# Patient Record
Sex: Male | Born: 1980 | Race: White | Hispanic: No | Marital: Married | Smoking: Never smoker
Health system: Southern US, Community
[De-identification: ages and names within clinical notes are randomized; demographics above are authoritative.]

## PROBLEM LIST (undated history)

## (undated) DIAGNOSIS — K219 Gastro-esophageal reflux disease without esophagitis: Secondary | ICD-10-CM

## (undated) DIAGNOSIS — I10 Essential (primary) hypertension: Secondary | ICD-10-CM

## (undated) DIAGNOSIS — E785 Hyperlipidemia, unspecified: Secondary | ICD-10-CM

## (undated) HISTORY — DX: Gastro-esophageal reflux disease without esophagitis: K21.9

## (undated) HISTORY — DX: Hyperlipidemia, unspecified: E78.5

## (undated) HISTORY — PX: HAND SURGERY: SHX662

## (undated) HISTORY — DX: Essential (primary) hypertension: I10

---

## 2016-06-19 ENCOUNTER — Ambulatory Visit (HOSPITAL_COMMUNITY): Admission: EM | Admit: 2016-06-19 | Discharge: 2016-06-19 | Discharge status: Absent without leave | Payer: 59

## 2016-06-19 ENCOUNTER — Emergency Department
Admission: EM | Admit: 2016-06-19 | Discharge: 2016-06-19 | Disposition: A | Discharge status: Home / Self Care | Payer: 59 | Attending: Emergency Medicine | Admitting: Emergency Medicine

## 2016-06-19 DIAGNOSIS — Y998 Other external cause status: Secondary | ICD-10-CM | POA: Insufficient documentation

## 2016-06-19 DIAGNOSIS — Y93B3 Activity, free weights: Secondary | ICD-10-CM | POA: Diagnosis not present

## 2016-06-19 DIAGNOSIS — X500XXA Overexertion from strenuous movement or load, initial encounter: Secondary | ICD-10-CM | POA: Insufficient documentation

## 2016-06-19 DIAGNOSIS — M5432 Sciatica, left side: Secondary | ICD-10-CM | POA: Diagnosis not present

## 2016-06-19 DIAGNOSIS — M5442 Lumbago with sciatica, left side: Secondary | ICD-10-CM | POA: Diagnosis not present

## 2016-06-19 DIAGNOSIS — M545 Low back pain: Secondary | ICD-10-CM | POA: Diagnosis not present

## 2016-06-19 DIAGNOSIS — S39012A Strain of muscle, fascia and tendon of lower back, initial encounter: Secondary | ICD-10-CM | POA: Diagnosis not present

## 2016-06-19 DIAGNOSIS — Y929 Unspecified place or not applicable: Secondary | ICD-10-CM | POA: Diagnosis not present

## 2016-06-19 MED ORDER — PREDNISONE 10 MG PO TABS: 10.0000 mg | ORAL_TABLET | Freq: Every day | ORAL | 0 refills | Status: DC

## 2016-06-19 MED ORDER — KETOROLAC TROMETHAMINE 30 MG/ML IJ SOLN
30.0000 mg | Freq: Once | INTRAMUSCULAR | Status: AC
  Administered 2016-06-19: 30 mg via INTRAMUSCULAR
  Filled 2016-06-19: qty 1

## 2016-06-19 MED ORDER — DEXAMETHASONE SODIUM PHOSPHATE 10 MG/ML IJ SOLN
10.0000 mg | Freq: Once | INTRAMUSCULAR | Status: AC
  Administered 2016-06-19: 10 mg via INTRAMUSCULAR
  Filled 2016-06-19: qty 1

## 2016-06-19 MED ORDER — HYDROCODONE-ACETAMINOPHEN 5-325 MG PO TABS: 1.0000 | ORAL_TABLET | ORAL | 0 refills | Status: DC | PRN

## 2016-06-19 MED ORDER — DIAZEPAM 5 MG PO TABS: 5.0000 mg | ORAL_TABLET | Freq: Three times a day (TID) | ORAL | 0 refills | Status: AC | PRN

## 2016-06-19 NOTE — Discharge Instructions (Signed)
Please take medications as prescribed and follow-up with orthopedics office. Return to the ER for any loss of bowel or bladder function, weakness, inability to ambulate, any worsening symptoms urgent changes in health.

## 2016-06-19 NOTE — ED Notes (Signed)
AAOx3.  Skin warm and dry.  NAD 

## 2016-06-19 NOTE — ED Notes (Signed)
AAOx3.  Skin warm and dry.  NAD.  Posture upright and relaxed.  MAE equally and strong.

## 2016-06-19 NOTE — ED Triage Notes (Signed)
Pt presents to ED c/o back pain post heavy lifting at the gym. Pt went to urgent care and had xrays done. Because he has tingling in his legs he was asked to come here for further imaging

## 2016-06-19 NOTE — ED Notes (Signed)
Back pain, was told at urgent care he has a slipped disc, had XR and was told to come to for MRI. Pt has copy of CD with him.

## 2016-06-19 NOTE — ED Provider Notes (Signed)
[facility] Provider Note   CSN: [id number] Arrival date & time: 06/19/16  1511     History   Chief Complaint Chief Complaint  Patient presents with  . Back Pain    HPI Jacob Miranda is a 36 y.o. male presents to the emergency department for evaluation of acute lower back pain with radicular symptoms in the left leg. Patient states at 10 AM this morning he was dead lifting, approximately 150 pounds when he felt a sharp pain and pop in his lower back with severe tightness along the left and right paravertebral muscles of the lower lumbar spine. He developed instant burning numbness and tingling down the left leg, lateral thigh posterior lateral calf into the left great toe. Patient had a hard time ambulating, was able to take a shower and go to the urgent care facility where x-rays were taken and show no evidence of acute bony abnormality of the lumbar spine. Patient states he was sent to the emergency department for MRI of his lumbar spine. Patient has taken ibuprofen which is given a mild improvement. His pain is currently 7 out of 10. Earlier his pain was 10 out of 10. He is now ambulatory with mild tingling in the left leg, L5 distribution. Left leg numbness is increased with standing and hip flexion. He has had full control of his bowel and bladder, denies any loss of function. He denies any saddle anesthesia or numbness throughout the lower extremities. He denies any weakness of the lower extremities. No abdominal pain.  HPI  No past medical history on file.  There are no active problems to display for this patient.   No past surgical history on file.     Home Medications    Prior to Admission medications   Medication Sig Start Date End Date Taking? Authorizing Provider  diazepam (VALIUM) 5 MG tablet Take 1 tablet (5 mg total) by mouth every 8 (eight) hours as needed for anxiety. 06/19/16 06/19/17  Duanne Guess, PA-C  HYDROcodone-acetaminophen (NORCO)  5-325 MG tablet Take 1 tablet by mouth every 4 (four) hours as needed for moderate pain. 06/19/16   Duanne Guess, PA-C  predniSONE (DELTASONE) 10 MG tablet Take 1 tablet (10 mg total) by mouth daily. 6,5,4,3,2,1 six day taper 06/19/16   Duanne Guess, PA-C    Family History No family history on file.  Social History Social History  Substance Use Topics  . Smoking status: Not on file  . Smokeless tobacco: Not on file  . Alcohol use Not on file     Allergies   Patient has no known allergies.   Review of Systems Review of Systems  Constitutional: Negative.  Negative for activity change, appetite change, chills and fever.  HENT: Negative for congestion, ear pain, mouth sores, rhinorrhea, sinus pressure, sore throat and trouble swallowing.   Eyes: Negative for photophobia, pain and discharge.  Respiratory: Negative for cough, chest tightness and shortness of breath.   Cardiovascular: Negative for chest pain and leg swelling.  Gastrointestinal: Negative for abdominal distention, abdominal pain, diarrhea, nausea and vomiting.  Genitourinary: Negative for difficulty urinating and dysuria.  Musculoskeletal: Positive for back pain, gait problem and myalgias. Negative for arthralgias.  Skin: Negative for color change and rash.  Neurological: Positive for numbness. Negative for dizziness, weakness and headaches.  Hematological: Negative for adenopathy.  Psychiatric/Behavioral: Negative for agitation and behavioral problems.     Physical Exam Updated Vital Signs BP (!) 130/95 (BP Location: Left Arm)  Pulse 88   Temp 98.2 F (36.8 C) (Oral)   Resp 18   Ht 5\' 7"  (1.702 m)   Wt 86.2 kg (190 lb)   SpO2 99%   BMI 29.76 kg/m   Physical Exam  Constitutional: He appears well-developed and well-nourished. No distress.  Patient ambulatory with no assisted devices. Minimal pain with ambulation.  HENT:  Head: Normocephalic and atraumatic.  Eyes: Conjunctivae are normal.  Neck:  Normal range of motion. Neck supple.  Cardiovascular: Normal rate and regular rhythm.   Pulmonary/Chest: Effort normal and breath sounds normal. No respiratory distress.  Abdominal: Soft. He exhibits no distension. There is no tenderness.  Musculoskeletal:  Lumbar Spine: Examination of the lumbar spine reveals no bony abnormality, no edema, and no ecchymosis.  There is no step off.  The patient has full range of motion of the lumbar spine with flexion and extension.  The patient has normal lateral bend and rotation.  The patient has pain with lumbar flexion and extension.   The patient is mildly tender along the paravertebral muscles of the lumbar spine. No spinous process tenderness.  The patient is non tender along the iliac crest.  The patient is non tender in the sciatic notch.  The patient is non tender along the Sacroiliac joint.  There is no Coccyx joint tenderness.    Bilateral Lower Extremities: Examination of the lower extremities reveals no bony abnormality, no edema, and no ecchymosis.  The patient has full active and passive range of motion of the hips, knees, and ankles.  There is no discomfort with range of motion exercises.  The patient is non tender along the greater trochanter region.  The patient has a negative Bevelyn Buckles' test bilaterally.  There is normal skin warmth.  There is normal capillary refill bilaterally.    Neurologic: The patient has a negative straight leg raise and negative contralateral straight leg raise.  The patient has normal muscle strength testing for the quadriceps, calves, ankle dorsiflexion, ankle plantarflexion, and extensor hallicus longus.  The patient has sensation that is intact to light touch. No sensation discrepancy, no saddle anesthesia. The deep tendon reflexes are normal along the patella and Achilles bilaterally with no clonus noted.   Neurological: He is alert.  Skin: Skin is warm and dry.  Psychiatric: He has a normal mood and affect.  Nursing  note and vitals reviewed.    ED Treatments / Results  Labs (all labs ordered are listed, but only abnormal results are displayed) Labs Reviewed - No data to display  EKG  EKG Interpretation None       Radiology No results found.  Procedures Procedures (including critical care time) Rectal exam is normal, no external hemorrhoids, normal sphincter tone  Post void residual value 40  Medications Ordered in ED Medications  dexamethasone (DECADRON) injection 10 mg (10 mg Intramuscular Given 06/19/16 1638)  ketorolac (TORADOL) 30 MG/ML injection 30 mg (30 mg Intramuscular Given 06/19/16 1636)     Initial Impression / Assessment and Plan / ED Course  I have reviewed the triage vital signs and the nursing notes.  Pertinent labs & imaging results that were available during my care of the patient were reviewed by me and considered in my medical decision making (see chart for details).   36 year old male with acute lower back pain and left L5 radiculopathy. He has no weakness or neurological deficit. No saddle anesthesia. He is given 10 mg of dexamethasone IM, 30 mg of Toradol IM, symptoms significantly improved.  Sphincter tone on rectal exam as well as post for residual value within normal limits. Patient is treated with 6 day steroid taper, given Valium to help with lower lumbar muscular tightness. He will follow-up with orthopedics first of next week. He is educated on signs and symptoms to return to the ED for.  Final Clinical Impressions(s) / ED Diagnoses   Final diagnoses:  Sciatica of left side  Strain of lumbar region, initial encounter    New Prescriptions Discharge Medication List as of 06/19/2016  5:42 PM    START taking these medications   Details  diazepam (VALIUM) 5 MG tablet Take 1 tablet (5 mg total) by mouth every 8 (eight) hours as needed for anxiety., Starting Sun 06/19/2016, Until Mon 06/19/2017, Print    HYDROcodone-acetaminophen (NORCO) 5-325 MG tablet Take 1  tablet by mouth every 4 (four) hours as needed for moderate pain., Starting Sun 06/19/2016, Print    predniSONE (DELTASONE) 10 MG tablet Take 1 tablet (10 mg total) by mouth daily. 6,5,4,3,2,1 six day taper, Starting Sun 06/19/2016, Print         Duanne Guess, PA-C 06/19/16 1859    Nance Pear, MD 06/26/16 [phone]

## 2016-08-29 ENCOUNTER — Other Ambulatory Visit: Payer: Self-pay | Admitting: Internal Medicine

## 2016-08-29 DIAGNOSIS — E785 Hyperlipidemia, unspecified: Secondary | ICD-10-CM | POA: Insufficient documentation

## 2016-08-29 DIAGNOSIS — I10 Essential (primary) hypertension: Secondary | ICD-10-CM | POA: Diagnosis not present

## 2016-08-29 DIAGNOSIS — Z0000 Encounter for general adult medical examination without abnormal findings: Secondary | ICD-10-CM | POA: Diagnosis not present

## 2016-08-29 DIAGNOSIS — F419 Anxiety disorder, unspecified: Secondary | ICD-10-CM | POA: Insufficient documentation

## 2016-08-29 DIAGNOSIS — M5431 Sciatica, right side: Secondary | ICD-10-CM

## 2016-08-29 DIAGNOSIS — K219 Gastro-esophageal reflux disease without esophagitis: Secondary | ICD-10-CM | POA: Insufficient documentation

## 2016-08-31 DIAGNOSIS — E782 Mixed hyperlipidemia: Secondary | ICD-10-CM | POA: Diagnosis not present

## 2016-08-31 DIAGNOSIS — I10 Essential (primary) hypertension: Secondary | ICD-10-CM | POA: Diagnosis not present

## 2016-08-31 DIAGNOSIS — Z125 Encounter for screening for malignant neoplasm of prostate: Secondary | ICD-10-CM | POA: Diagnosis not present

## 2016-08-31 DIAGNOSIS — Z79899 Other long term (current) drug therapy: Secondary | ICD-10-CM | POA: Diagnosis not present

## 2016-09-05 ENCOUNTER — Ambulatory Visit: Payer: 59

## 2016-09-19 DIAGNOSIS — M9902 Segmental and somatic dysfunction of thoracic region: Secondary | ICD-10-CM | POA: Diagnosis not present

## 2016-09-19 DIAGNOSIS — R51 Headache: Secondary | ICD-10-CM | POA: Diagnosis not present

## 2016-09-19 DIAGNOSIS — M9901 Segmental and somatic dysfunction of cervical region: Secondary | ICD-10-CM | POA: Diagnosis not present

## 2016-09-22 DIAGNOSIS — M9902 Segmental and somatic dysfunction of thoracic region: Secondary | ICD-10-CM | POA: Diagnosis not present

## 2016-09-22 DIAGNOSIS — M9901 Segmental and somatic dysfunction of cervical region: Secondary | ICD-10-CM | POA: Diagnosis not present

## 2016-09-22 DIAGNOSIS — R51 Headache: Secondary | ICD-10-CM | POA: Diagnosis not present

## 2016-09-23 DIAGNOSIS — T63441A Toxic effect of venom of bees, accidental (unintentional), initial encounter: Secondary | ICD-10-CM | POA: Diagnosis not present

## 2016-09-23 DIAGNOSIS — Z23 Encounter for immunization: Secondary | ICD-10-CM | POA: Diagnosis not present

## 2016-09-23 DIAGNOSIS — L03114 Cellulitis of left upper limb: Secondary | ICD-10-CM | POA: Diagnosis not present

## 2016-09-27 DIAGNOSIS — R51 Headache: Secondary | ICD-10-CM | POA: Diagnosis not present

## 2016-09-27 DIAGNOSIS — M9901 Segmental and somatic dysfunction of cervical region: Secondary | ICD-10-CM | POA: Diagnosis not present

## 2016-09-27 DIAGNOSIS — M9902 Segmental and somatic dysfunction of thoracic region: Secondary | ICD-10-CM | POA: Diagnosis not present

## 2016-11-17 DIAGNOSIS — J40 Bronchitis, not specified as acute or chronic: Secondary | ICD-10-CM | POA: Diagnosis not present

## 2017-01-04 DIAGNOSIS — E782 Mixed hyperlipidemia: Secondary | ICD-10-CM | POA: Diagnosis not present

## 2017-01-04 DIAGNOSIS — I10 Essential (primary) hypertension: Secondary | ICD-10-CM | POA: Diagnosis not present

## 2017-04-03 DIAGNOSIS — M9902 Segmental and somatic dysfunction of thoracic region: Secondary | ICD-10-CM | POA: Diagnosis not present

## 2017-04-03 DIAGNOSIS — M9901 Segmental and somatic dysfunction of cervical region: Secondary | ICD-10-CM | POA: Diagnosis not present

## 2017-04-03 DIAGNOSIS — R51 Headache: Secondary | ICD-10-CM | POA: Diagnosis not present

## 2017-06-13 DIAGNOSIS — E782 Mixed hyperlipidemia: Secondary | ICD-10-CM | POA: Diagnosis not present

## 2017-06-13 DIAGNOSIS — Z79899 Other long term (current) drug therapy: Secondary | ICD-10-CM | POA: Diagnosis not present

## 2017-06-20 DIAGNOSIS — I10 Essential (primary) hypertension: Secondary | ICD-10-CM | POA: Diagnosis not present

## 2017-06-20 DIAGNOSIS — E782 Mixed hyperlipidemia: Secondary | ICD-10-CM | POA: Diagnosis not present

## 2017-07-03 DIAGNOSIS — R109 Unspecified abdominal pain: Secondary | ICD-10-CM | POA: Diagnosis not present

## 2017-07-11 DIAGNOSIS — R3129 Other microscopic hematuria: Secondary | ICD-10-CM | POA: Diagnosis not present

## 2017-07-12 ENCOUNTER — Other Ambulatory Visit: Payer: Self-pay | Admitting: Internal Medicine

## 2017-07-12 DIAGNOSIS — R3129 Other microscopic hematuria: Secondary | ICD-10-CM

## 2017-07-14 ENCOUNTER — Ambulatory Visit
Admission: RE | Admit: 2017-07-14 | Discharge: 2017-07-14 | Disposition: A | Discharge status: Home / Self Care | Payer: 59 | Source: Ambulatory Visit | Attending: Internal Medicine | Admitting: Internal Medicine

## 2017-07-14 DIAGNOSIS — R3129 Other microscopic hematuria: Secondary | ICD-10-CM | POA: Insufficient documentation

## 2017-07-21 DIAGNOSIS — R319 Hematuria, unspecified: Secondary | ICD-10-CM | POA: Diagnosis not present

## 2017-07-27 ENCOUNTER — Emergency Department: Payer: 59

## 2017-07-27 ENCOUNTER — Other Ambulatory Visit: Payer: Self-pay

## 2017-07-27 ENCOUNTER — Emergency Department
Admission: EM | Admit: 2017-07-27 | Discharge: 2017-07-27 | Disposition: A | Discharge status: Home / Self Care | Payer: 59 | Attending: Emergency Medicine | Admitting: Emergency Medicine

## 2017-07-27 ENCOUNTER — Encounter: Payer: Self-pay | Admitting: Emergency Medicine

## 2017-07-27 DIAGNOSIS — I10 Essential (primary) hypertension: Secondary | ICD-10-CM | POA: Insufficient documentation

## 2017-07-27 DIAGNOSIS — R1031 Right lower quadrant pain: Secondary | ICD-10-CM | POA: Diagnosis not present

## 2017-07-27 DIAGNOSIS — R109 Unspecified abdominal pain: Secondary | ICD-10-CM

## 2017-07-27 DIAGNOSIS — R110 Nausea: Secondary | ICD-10-CM | POA: Diagnosis not present

## 2017-07-27 DIAGNOSIS — R1011 Right upper quadrant pain: Secondary | ICD-10-CM | POA: Insufficient documentation

## 2017-07-27 LAB — CBC WITH DIFFERENTIAL/PLATELET
BASOS PCT: 1 %
Basophils Absolute: 0.1 10*3/uL (ref 0–0.1)
Eosinophils Absolute: 0.2 10*3/uL (ref 0–0.7)
Eosinophils Relative: 3 %
HEMATOCRIT: 47.6 % (ref 40.0–52.0)
Hemoglobin: 16.8 g/dL (ref 13.0–18.0)
LYMPHS ABS: 2.7 10*3/uL (ref 1.0–3.6)
Lymphocytes Relative: 32 %
MCH: 30.9 pg (ref 26.0–34.0)
MCHC: 35.4 g/dL (ref 32.0–36.0)
MCV: 87.3 fL (ref 80.0–100.0)
MONOS PCT: 10 %
Monocytes Absolute: 0.8 10*3/uL (ref 0.2–1.0)
NEUTROS ABS: 4.7 10*3/uL (ref 1.4–6.5)
NEUTROS PCT: 54 %
Platelets: 253 10*3/uL (ref 150–440)
RBC: 5.45 MIL/uL (ref 4.40–5.90)
RDW: 12.2 % (ref 11.5–14.5)
WBC: 8.6 10*3/uL (ref 3.8–10.6)

## 2017-07-27 LAB — COMPREHENSIVE METABOLIC PANEL
ALT: 28 U/L (ref 0–44)
ANION GAP: 8 (ref 5–15)
AST: 26 U/L (ref 15–41)
Albumin: 5 g/dL (ref 3.5–5.0)
Alkaline Phosphatase: 49 U/L (ref 38–126)
BUN: 20 mg/dL (ref 6–20)
CHLORIDE: 102 mmol/L (ref 98–111)
CO2: 28 mmol/L (ref 22–32)
Calcium: 9.7 mg/dL (ref 8.9–10.3)
Creatinine, Ser: 0.92 mg/dL (ref 0.61–1.24)
Glucose, Bld: 102 mg/dL — ABNORMAL HIGH (ref 70–99)
POTASSIUM: 4 mmol/L (ref 3.5–5.1)
SODIUM: 138 mmol/L (ref 135–145)
Total Bilirubin: 1.1 mg/dL (ref 0.3–1.2)
Total Protein: 8.8 g/dL — ABNORMAL HIGH (ref 6.5–8.1)

## 2017-07-27 LAB — URINALYSIS, COMPLETE (UACMP) WITH MICROSCOPIC
BACTERIA UA: NONE SEEN
BILIRUBIN URINE: NEGATIVE
GLUCOSE, UA: NEGATIVE mg/dL
KETONES UR: NEGATIVE mg/dL
Leukocytes, UA: NEGATIVE
NITRITE: NEGATIVE
PROTEIN: NEGATIVE mg/dL
Specific Gravity, Urine: 1.021 (ref 1.005–1.030)
pH: 5 (ref 5.0–8.0)

## 2017-07-27 NOTE — ED Notes (Signed)
Pt alert and oriented X4, active, cooperative, pt in NAD. RR even and unlabored, color WNL.  Pt informed to return if any life threatening symptoms occur.  Discharge and followup instructions reviewed.  

## 2017-07-27 NOTE — ED Triage Notes (Signed)
Right side abd pain for couple weeks.  Had ultrasound  and it is not stones.  Says pain worse now.

## 2017-07-27 NOTE — ED Notes (Signed)
ED Provider at bedside. 

## 2017-07-27 NOTE — Discharge Instructions (Addendum)

## 2017-07-27 NOTE — ED Provider Notes (Signed)
[facility] Emergency Department Provider Note  ____________________________________________  Time seen: Approximately 9:02 AM  I have reviewed the triage vital signs and the nursing notes.   HISTORY  Chief Complaint Abdominal Pain   HPI Jacob Miranda is a 37 y.o. male with a history of hypertension, hyperlipidemia and GERD who presents for evaluation of right flank pain.  Patient reports intermittent right-sided flank pain for several weeks.  He reports the pain as a dull ache pain has been intermittent, moderate in intensity, located in the right flank and right sided abdomen.  He has had some nausea but no vomiting, no fever or chills.  Pain is not worse after eating.  Pain is nonpleuritic in nature.  He saw his primary care doctor twice for this problem and both times was noted to have microscopic hematuria on urinalysis.  He underwent a renal ultrasound which was unremarkable.  Patient reports that the pain started again today.  He denies dysuria or hematuria, fever or chills.  He does report history of constipation and diarrhea for the last several months.  No prior history of IBD, IBS, celiac.  No unintentional weight loss or night sweats.  No personal family history of blood clots, no recent travel immobilization, no leg pain or swelling, no hemoptysis, no exogenous hormones, no history of cancer.  PMH HTN HLD GERD  Past Surgical History:  Procedure Laterality Date  . HAND SURGERY Right     Prior to Admission medications   Medication Sig Start Date End Date Taking? Authorizing Provider  HYDROcodone-acetaminophen (NORCO) 5-325 MG tablet Take 1 tablet by mouth every 4 (four) hours as needed for moderate pain. 06/19/16   Duanne Guess, PA-C  predniSONE (DELTASONE) 10 MG tablet Take 1 tablet (10 mg total) by mouth daily. 6,5,4,3,2,1 six day taper 06/19/16   Duanne Guess, PA-C    Allergies Penicillin g  No family history on file.  Social  History Social History   Tobacco Use  . Smoking status: Never Smoker  . Smokeless tobacco: Never Used  Substance Use Topics  . Alcohol use: Yes  . Drug use: Not on file    Review of Systems  Constitutional: Negative for fever. Eyes: Negative for visual changes. ENT: Negative for sore throat. Neck: No neck pain  Cardiovascular: Negative for chest pain. Respiratory: Negative for shortness of breath. Gastrointestinal: + R sided abdominal pain. No vomiting or diarrhea. Genitourinary: Negative for dysuria. + R flank pain Musculoskeletal: Negative for back pain. Skin: Negative for rash. Neurological: Negative for headaches, weakness or numbness. Psych: No SI or HI  ____________________________________________   PHYSICAL EXAM:  VITAL SIGNS: ED Triage Vitals  Enc Vitals Group     BP 07/27/17 0830 123/88     Pulse Rate 07/27/17 0830 76     Resp 07/27/17 0830 14     Temp 07/27/17 0830 97.7 F (36.5 C)     Temp Source 07/27/17 0830 Oral     SpO2 07/27/17 0830 99 %     Weight 07/27/17 0826 195 lb (88.5 kg)     Height 07/27/17 0826 5\' 7"  (1.702 m)     Head Circumference --      Peak Flow --      Pain Score 07/27/17 0826 7     Pain Loc --      Pain Edu? --      Excl. in [place]? --     Constitutional: Alert and oriented. Well appearing and in no apparent  distress. HEENT:      Head: Normocephalic and atraumatic.         Eyes: Conjunctivae are normal. Sclera is non-icteric.       Mouth/Throat: Mucous membranes are moist.       Neck: Supple with no signs of meningismus. Cardiovascular: Regular rate and rhythm. No murmurs, gallops, or rubs. 2+ symmetrical distal pulses are present in all extremities. No JVD. Respiratory: Normal respiratory effort. Lungs are clear to auscultation bilaterally. No wheezes, crackles, or rhonchi.  Gastrointestinal: Soft, mild tenderness to palpation over the RUQ, and non distended with positive bowel sounds. No rebound or guarding. Genitourinary: No  CVA tenderness. Musculoskeletal: Nontender with normal range of motion in all extremities. No edema, cyanosis, or erythema of extremities. Neurologic: Normal speech and language. Face is symmetric. Moving all extremities. No gross focal neurologic deficits are appreciated. Skin: Skin is warm, dry and intact. No rash noted. Psychiatric: Mood and affect are normal. Speech and behavior are normal.  ____________________________________________   LABS (all labs ordered are listed, but only abnormal results are displayed)  Labs Reviewed  URINALYSIS, COMPLETE (UACMP) WITH MICROSCOPIC - Abnormal; Notable for the following components:      Result Value   Color, Urine YELLOW (*)    APPearance CLEAR (*)    Hgb urine dipstick SMALL (*)    All other components within normal limits  COMPREHENSIVE METABOLIC PANEL - Abnormal; Notable for the following components:   Glucose, Bld 102 (*)    Total Protein 8.8 (*)    All other components within normal limits  CBC WITH DIFFERENTIAL/PLATELET   ____________________________________________  EKG  none  ____________________________________________  RADIOLOGY  I have personally reviewed the images performed during this visit and I agree with the Radiologist's read.   Interpretation by Radiologist:  Ct Renal Stone Study  Result Date: 07/27/2017 CLINICAL DATA:  Right-sided abdominal pain for several weeks EXAM: CT ABDOMEN AND PELVIS WITHOUT CONTRAST TECHNIQUE: Multidetector CT imaging of the abdomen and pelvis was performed following the standard protocol without IV contrast. COMPARISON:  Renal ultrasound from 07/14/2017 FINDINGS: Lower chest: No acute abnormality. Hepatobiliary: No focal liver abnormality is seen. No gallstones, gallbladder wall thickening, or biliary dilatation. Pancreas: Unremarkable. No pancreatic ductal dilatation or surrounding inflammatory changes. Spleen: Normal in size without focal abnormality. Adrenals/Urinary Tract: Adrenal  glands are unremarkable. Kidneys are normal, without renal calculi, focal lesion, or hydronephrosis. Bladder is decompressed. Stomach/Bowel: Stomach is within normal limits. Appendix appears normal. No evidence of bowel wall thickening, distention, or inflammatory changes. Vascular/Lymphatic: No significant vascular findings are present. No enlarged abdominal or pelvic lymph nodes. Reproductive: Prostate is unremarkable. Other: No abdominal wall hernia or abnormality. No abdominopelvic ascites. Musculoskeletal: No acute or significant osseous findings. IMPRESSION: No acute abnormality noted. Electronically Signed   By: Inez Catalina M.D.   On: 07/27/2017 09:03   US Abdomen Limited Ruq  Result Date: 07/27/2017 CLINICAL DATA:  Right upper quadrant pain EXAM: ULTRASOUND ABDOMEN LIMITED RIGHT UPPER QUADRANT COMPARISON:  CT earlier today FINDINGS: Gallbladder: No gallstones or wall thickening visualized. No sonographic Murphy sign noted by sonographer. Common bile duct: Diameter: Normal caliber, 3 mm Liver: No focal lesion identified. Within normal limits in parenchymal echogenicity. Portal vein is patent on color Doppler imaging with normal direction of blood flow towards the liver. IMPRESSION: Negative right upper quadrant ultrasound. Electronically Signed   By: Rolm Baptise M.D.   On: 07/27/2017 09:54     ____________________________________________   PROCEDURES  Procedure(s) performed: None Procedures  Critical Care performed:  None ____________________________________________   INITIAL IMPRESSION / ASSESSMENT AND PLAN / ED COURSE   37 y.o. male with a history of hypertension, hyperlipidemia and GERD who presents for evaluation of right flank pain/ Right sided abdominal pain intermittent for several weeks associated with microscopic hematuria.  Patient is well-appearing, no distress, has normal vital signs, abdomen is slightly tender on the right upper quadrant with no rebound or guarding, no Murphy  sign, no right lower quadrant tenderness, no flank tenderness.  Urinalysis showing small amount of hemoglobin but no RBCs.  Labs including CBC and CMP are pending.  CT renal showing no evidence of kidney stone, renal cyst, appendicitis, pancreatitis, small bowel obstruction.  Possibly gallbladder disease although no stones are seen on CT scan. PERC negative    _________________________ 10:29 AM on 07/27/2017 -----------------------------------------  CT renal with no acute findings, right upper quadrant ultrasound negative for gallbladder etiology.  Patient remains well-appearing.  Will refer to GI for further evaluation.  Discussed return precautions for new or worsening pain, fever, chest pain, shortness of breath.   As part of my medical decision making, I reviewed the following data within the [facility] notes reviewed and incorporated, Labs reviewed , Old chart reviewed, Radiograph reviewed , Discussed with admitting physician , Notes from prior ED visits and [place] Controlled Substance Database    Pertinent labs & imaging results that were available during my care of the patient were reviewed by me and considered in my medical decision making (see chart for details).    ____________________________________________   FINAL CLINICAL IMPRESSION(S) / ED DIAGNOSES  Final diagnoses:  RUQ abdominal pain  Flank pain      NEW MEDICATIONS STARTED DURING THIS VISIT:  ED Discharge Orders    None       Note:  This document was prepared using Dragon voice recognition software and may include unintentional dictation errors.    Alfred Levins, [place], MD 07/27/17 1030

## 2017-08-24 DIAGNOSIS — R109 Unspecified abdominal pain: Secondary | ICD-10-CM | POA: Diagnosis not present

## 2017-08-24 DIAGNOSIS — R319 Hematuria, unspecified: Secondary | ICD-10-CM | POA: Diagnosis not present

## 2017-08-25 DIAGNOSIS — R1031 Right lower quadrant pain: Secondary | ICD-10-CM | POA: Diagnosis not present

## 2017-08-25 DIAGNOSIS — R3129 Other microscopic hematuria: Secondary | ICD-10-CM | POA: Diagnosis not present

## 2017-08-25 DIAGNOSIS — R300 Dysuria: Secondary | ICD-10-CM | POA: Diagnosis not present

## 2017-09-04 ENCOUNTER — Encounter

## 2017-09-04 ENCOUNTER — Ambulatory Visit: Payer: 59 | Admitting: Gastroenterology

## 2017-09-04 ENCOUNTER — Encounter: Payer: Self-pay | Admitting: Gastroenterology

## 2017-09-04 VITALS — BP 121/84 | HR 61 | Ht 67.0 in | Wt 198.2 lb

## 2017-09-04 DIAGNOSIS — Z8619 Personal history of other infectious and parasitic diseases: Secondary | ICD-10-CM

## 2017-09-04 DIAGNOSIS — R1030 Lower abdominal pain, unspecified: Secondary | ICD-10-CM | POA: Diagnosis not present

## 2017-09-04 NOTE — Progress Notes (Signed)
Jacob Miranda [address]  [place]  [place], [place] [address]  Main: [phone]  Fax: [phone]   Gastroenterology Consultation  Referring Provider:     Idelle Crouch, MD Primary Care Physician:  Idelle Crouch, MD Primary Gastroenterologist:  Dr. Vonda Miranda Reason for Consultation:     Abdominal pain        HPI:    Chief Complaint  Patient presents with  . New Patient (Initial Visit)    ED F/U right flank pain and nausea    Jacob Miranda is a 37 y.o. y/o male referred for consultation & management  by Dr. Idelle Crouch, MD.  Patient visited the ER in July 2019 due to right flank pain and abdominal pain.  Symptoms started 6 weeks ago.  Right flank pain occurs more than abdominal pain and they occur at different timings.  Right flank pain is dull, burning, nonradiating, not associated with any nausea or vomiting, 5/10, comes on with certain movements such as rotating to that side, and relieves with other movements.  Abdominal pain occurs unrelated to the right flank pain, is bilateral lower quadrant to periumbilical area, sharp, occurs more when he is laying down, 8/10, nonradiating, not associate with any nausea or vomiting.  No dysphagia or weight loss.  No blood in stool.  Reports 1-2 formed bowel movements a day without straining.  Reports history of GERD and used to be on daily ranitidine for a year, about 4 to 5 years ago.  Also states he was treated for H. pylori around that time.  Stop taking the ranitidine as he did not want to be on chronic medications.  Now reports once a day bad taste in mouth or belching.  No prior EGDs or colonoscopies.  Eats a high protein diet.  Takes Metamucil as needed when he has not had enough fiber as he takes his protein to fiber ratio, is higher in protein.  No family history of colon cancer  Past medical history: GERD, history of H. pylori  Past Surgical History:  Procedure Laterality Date  . HAND  SURGERY Right     Prior to Admission medications   Medication Sig Start Date End Date Taking? Authorizing Provider  ALPRAZolam Duanne Moron) 0.5 MG tablet  09/01/17   [provider]  bisoprolol-hydrochlorothiazide (ZIAC) 5-6.25 MG tablet Take 1 tablet by mouth daily. 08/27/17   [provider]  levofloxacin (LEVAQUIN) 250 MG tablet Take 250 mg by mouth daily. 08/25/17   [provider]    Family history: No family history of colon cancer, or GI cancers, or any other cancers in the immediate family.  Brother has Crohn's disease or ulcerative colitis, patient does not know which one  Social History   Tobacco Use  . Smoking status: Never Smoker  . Smokeless tobacco: Never Used  Substance Use Topics  . Alcohol use: Yes  . Drug use: Not on file    Allergies as of 09/04/2017 - Review Complete 09/04/2017  Allergen Reaction Noted  . Penicillin g Other (See Comments) 08/29/2016    Review of Systems:    All systems reviewed and negative except where noted in HPI.   Physical Exam:  BP 121/84   Pulse 61   Ht 5\' 7"  (1.702 m)   Wt 198 lb 3.2 oz (89.9 kg)   BMI 31.04 kg/m  No LMP for male patient. Psych:  Alert and cooperative. Normal mood and affect. General:   Alert,  Well-developed, well-nourished,  pleasant and cooperative in NAD Head:  Normocephalic and atraumatic. Eyes:  Sclera clear, no icterus.   Conjunctiva pink. Ears:  Normal auditory acuity. Nose:  No deformity, discharge, or lesions. Mouth:  No deformity or lesions,oropharynx pink & moist. Neck:  Supple; no masses or thyromegaly. Abdomen:  Normal bowel sounds.  No bruits.  Soft, non-tender and non-distended without masses, hepatosplenomegaly or hernias noted.  No guarding or rebound tenderness.    Msk:  Symmetrical without gross deformities. Good, equal movement & strength bilaterally. Pulses:  Normal pulses noted. Extremities:  No clubbing or edema.  No cyanosis. Neurologic:  Alert and oriented x3;   grossly normal neurologically. Skin:  Intact without significant lesions or rashes. No jaundice. Lymph Nodes:  No significant cervical adenopathy. Psych:  Alert and cooperative. Normal mood and affect.   Labs: CBC    Component Value Date/Time   WBC 8.6 07/27/2017 0833   RBC 5.45 07/27/2017 0833   HGB 16.8 07/27/2017 0833   HCT 47.6 07/27/2017 0833   PLT 253 07/27/2017 0833   MCV 87.3 07/27/2017 0833   MCH 30.9 07/27/2017 0833   MCHC 35.4 07/27/2017 0833   RDW 12.2 07/27/2017 0833   LYMPHSABS 2.7 07/27/2017 0833   MONOABS 0.8 07/27/2017 0833   EOSABS 0.2 07/27/2017 0833   BASOSABS 0.1 07/27/2017 0833   CMP     Component Value Date/Time   NA 138 07/27/2017 0838   K 4.0 07/27/2017 0838   CL 102 07/27/2017 0838   CO2 28 07/27/2017 0838   GLUCOSE 102 (H) 07/27/2017 0838   BUN 20 07/27/2017 0838   CREATININE 0.92 07/27/2017 0838   CALCIUM 9.7 07/27/2017 0838   PROT 8.8 (H) 07/27/2017 0838   ALBUMIN 5.0 07/27/2017 0838   AST 26 07/27/2017 0838   ALT 28 07/27/2017 0838   ALKPHOS 49 07/27/2017 0838   BILITOT 1.1 07/27/2017 0838   GFRNONAA >60 07/27/2017 0838   GFRAA >60 07/27/2017 4431    Imaging Studies: No results found.  Assessment and Plan:   [name] is a 37 y.o. y/o male has been referred for abdominal pain, and right flank pain  His abdominal pain and right flank pain appear to be unrelated, and do not occur at the same time Right flank pain is likely musculoskeletal He has microscopic hematuria for 2 months now, with no other urinary symptoms, and his pain has not resolved despite treatment antibiotics by his primary care provider. I have asked him to follow-up with urology as scheduled this week for this  In regard to his abdominal pain, his CT was negative and very reassuring Lab work is reassuring as well Patient works out frequently, 4-5 times a week, and lifts heavy equipment Abdominal pain may be musculoskeletal as well No alarm symptoms  present  Given his history of H. pylori, and he states his abdominal pain feels like bloating at times, will check stool for H. pylori He does not eat enough fiber in diet is heavy on protein High-fiber diet MiraLAX or Metamucil daily with goal of 1-2 soft bowel movements daily.  If not at goal, patient instructed to increase dose to twice daily.  If loose stools with the medication, patient asked to decrease the medication to every other day, or half dose daily.  Patient verbalized understanding  After urology work-up, will follow-up in clinic to see if symptoms have improved  For his daily heartburn, we will not start any new medications at this time Instead will concentrate on lifestyle  modifications until urology work-up is complete and to see if above measures help Patient educated extensively on acid reflux lifestyle modification, including buying a bed wedge, not eating 3 hrs before bedtime, diet modifications, and handout given for the same.   If symptoms do not improve, can consider H2 RA therapy  No alarm symptoms present to indicate EGD or colonoscopy at this time  Dr Jacob Miranda

## 2017-09-04 NOTE — Patient Instructions (Signed)
F/U 3 months Bed wedge miralax daily  High-Fiber Diet Fiber, also called dietary fiber, is a type of carbohydrate found in fruits, vegetables, whole grains, and beans. A high-fiber diet can have many health benefits. Your health care provider may recommend a high-fiber diet to help:  Prevent constipation. Fiber can make your bowel movements more regular.  Lower your cholesterol.  Relieve hemorrhoids, uncomplicated diverticulosis, or irritable bowel syndrome.  Prevent overeating as part of a weight-loss plan.  Prevent heart disease, type 2 diabetes, and certain cancers.  What is my plan? The recommended daily intake of fiber includes:  38 grams for men under age 41.  101 grams for men over age 33.  5 grams for women under age 26.  76 grams for women over age 107.  You can get the recommended daily intake of dietary fiber by eating a variety of fruits, vegetables, grains, and beans. Your health care provider may also recommend a fiber supplement if it is not possible to get enough fiber through your diet. What do I need to know about a high-fiber diet?  Fiber supplements have not been widely studied for their effectiveness, so it is better to get fiber through food sources.  Always check the fiber content on thenutrition facts label of any prepackaged food. Look for foods that contain at least 5 grams of fiber per serving.  Ask your dietitian if you have questions about specific foods that are related to your condition, especially if those foods are not listed in the following section.  Increase your daily fiber consumption gradually. Increasing your intake of dietary fiber too quickly may cause bloating, cramping, or gas.  Drink plenty of water. Water helps you to digest fiber. What foods can I eat? Grains Whole-grain breads. Multigrain cereal. Oats and oatmeal. Brown rice. Barley. Bulgur wheat. [place]. Bran muffins. Popcorn. Rye wafer crackers. Vegetables Sweet potatoes.  Spinach. Kale. Artichokes. Cabbage. Broccoli. Green peas. Carrots. Squash. Fruits Berries. Pears. Apples. Oranges. Avocados. Prunes and raisins. Dried figs. Meats and Other Protein Sources Navy, kidney, pinto, and soy beans. Split peas. Lentils. Nuts and seeds. Dairy Fiber-fortified yogurt. Beverages Fiber-fortified soy milk. Fiber-fortified orange juice. Other Fiber bars. The items listed above may not be a complete list of recommended foods or beverages. Contact your dietitian for more options. What foods are not recommended? Grains White bread. Pasta made with refined flour. White rice. Vegetables Fried potatoes. Canned vegetables. Well-cooked vegetables. Fruits Fruit juice. Cooked, strained fruit. Meats and Other Protein Sources Fatty cuts of meat. Fried Sales executive or fried fish. Dairy Milk. Yogurt. Cream cheese. Sour cream. Beverages Soft drinks. Other Cakes and pastries. Butter and oils. The items listed above may not be a complete list of foods and beverages to avoid. Contact your dietitian for more information. What are some tips for including high-fiber foods in my diet?  Eat a wide variety of high-fiber foods.  Make sure that half of all grains consumed each day are whole grains.  Replace breads and cereals made from refined flour or white flour with whole-grain breads and cereals.  Replace white rice with brown rice, bulgur wheat, or millet.  Start the day with a breakfast that is high in fiber, such as a cereal that contains at least 5 grams of fiber per serving.  Use beans in place of meat in soups, salads, or pasta.  Eat high-fiber snacks, such as berries, raw vegetables, nuts, or popcorn. This information is not intended to replace advice given to you by your  health care provider. Make sure you discuss any questions you have with your health care provider. Document Released: 01/10/2005 Document Revised: 06/18/2015 Document Reviewed: 06/25/2013 Elsevier  Interactive Patient Education  [employer].

## 2017-09-05 ENCOUNTER — Other Ambulatory Visit
Admission: RE | Admit: 2017-09-05 | Discharge: 2017-09-05 | Disposition: A | Discharge status: Home / Self Care | Payer: 59 | Source: Ambulatory Visit | Attending: Gastroenterology | Admitting: Gastroenterology

## 2017-09-05 DIAGNOSIS — Z8619 Personal history of other infectious and parasitic diseases: Secondary | ICD-10-CM | POA: Insufficient documentation

## 2017-09-06 LAB — H. PYLORI ANTIGEN, STOOL: H. PYLORI STOOL AG, EIA: NEGATIVE

## 2017-09-08 ENCOUNTER — Ambulatory Visit: Payer: 59 | Admitting: Urology

## 2017-09-08 ENCOUNTER — Telehealth: Payer: Self-pay | Admitting: Gastroenterology

## 2017-09-08 ENCOUNTER — Encounter: Payer: Self-pay | Admitting: Urology

## 2017-09-08 VITALS — BP 136/85 | HR 67 | Ht 67.0 in | Wt 195.8 lb

## 2017-09-08 DIAGNOSIS — R319 Hematuria, unspecified: Secondary | ICD-10-CM | POA: Diagnosis not present

## 2017-09-08 DIAGNOSIS — R3121 Asymptomatic microscopic hematuria: Secondary | ICD-10-CM

## 2017-09-08 DIAGNOSIS — R102 Pelvic and perineal pain: Secondary | ICD-10-CM

## 2017-09-08 LAB — MICROSCOPIC EXAMINATION
BACTERIA UA: NONE SEEN
Epithelial Cells (non renal): NONE SEEN /hpf (ref 0–10)
WBC, UA: NONE SEEN /hpf (ref 0–5)

## 2017-09-08 LAB — URINALYSIS, COMPLETE
Bilirubin, UA: NEGATIVE
GLUCOSE, UA: NEGATIVE
Ketones, UA: NEGATIVE
LEUKOCYTES UA: NEGATIVE
Nitrite, UA: NEGATIVE
PH UA: 5 (ref 5.0–7.5)
PROTEIN UA: NEGATIVE
SPEC GRAV UA: 1.02 (ref 1.005–1.030)
Urobilinogen, Ur: 0.2 mg/dL (ref 0.2–1.0)

## 2017-09-08 NOTE — Patient Instructions (Signed)
Cystoscopy  Cystoscopy is a procedure that is used to help diagnose and sometimes treat conditions that affect that lower urinary tract. The lower urinary tract includes the bladder and the tube that drains urine from the bladder out of the body (urethra). Cystoscopy is performed with a thin, tube-shaped instrument with a light and camera at the end (cystoscope). The cystoscope may be hard (rigid) or flexible, depending on the goal of the procedure.The cystoscope is inserted through the urethra, into the bladder.  Cystoscopy may be recommended if you have:   Urinary tractinfections that keep coming back (recurring).   Blood in the urine (hematuria).   Loss of bladder control (urinary incontinence) or an overactive bladder.   Unusual cells found in a urine sample.   A blockage in the urethra.   Painful urination.   An abnormality in the bladder found during an intravenous pyelogram (IVP) or CT scan.    Cystoscopy may also be done to remove a sample of tissue to be examined under a microscope (biopsy).  Tell a health care provider about:   Any allergies you have.   All medicines you are taking, including vitamins, herbs, eye drops, creams, and over-the-counter medicines.   Any problems you or family members have had with anesthetic medicines.   Any blood disorders you have.   Any surgeries you have had.   Any medical conditions you have.   Whether you are pregnant or may be pregnant.  What are the risks?  Generally, this is a safe procedure. However, problems may occur, including:   Infection.   Bleeding.   Allergic reactions to medicines.   Damage to other structures or organs.    What happens before the procedure?   Ask your health care provider about:  ? Changing or stopping your regular medicines. This is especially important if you are taking diabetes medicines or blood thinners.  ? Taking medicines such as aspirin and ibuprofen. These medicines can thin your blood. Do not take these medicines  before your procedure if your health care provider instructs you not to.   Follow instructions from your health care provider about eating or drinking restrictions.   You may be given antibiotic medicine to help prevent infection.   You may have an exam or testing, such as X-rays of the bladder, urethra, or kidneys.   You may have urine tests to check for signs of infection.   Plan to have someone take you home after the procedure.  What happens during the procedure?   To reduce your risk of infection,your health care team will wash or sanitize their hands.   You will be given one or more of the following:  ? A medicine to help you relax (sedative).  ? A medicine to numb the area (local anesthetic).   The area around the opening of your urethra will be cleaned.   The cystoscope will be passed through your urethra into your bladder.   Germ-free (sterile)fluid will flow through the cystoscope to fill your bladder. The fluid will stretch your bladder so that your surgeon can clearly examine your bladder walls.   The cystoscope will be removed and your bladder will be emptied.  The procedure may vary among health care providers and hospitals.  What happens after the procedure?   You may have some soreness or pain in your abdomen and urethra. Medicines will be available to help you.   You may have some blood in your urine.   Do not   drive for 24 hours if you received a sedative.  This information is not intended to replace advice given to you by your health care provider. Make sure you discuss any questions you have with your health care provider.  Document Released: 01/08/2000 Document Revised: 05/21/2015 Document Reviewed: 11/27/2014  Elsevier Interactive Patient Education  2018 Elsevier Inc.

## 2017-09-08 NOTE — Telephone Encounter (Signed)
Pt aware H Pylori stool test negative.
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
## 2017-09-08 NOTE — Telephone Encounter (Signed)
Pt  Left vm to check on stool sample results

## 2017-09-21 ENCOUNTER — Ambulatory Visit
Admission: RE | Admit: 2017-09-21 | Discharge: 2017-09-21 | Disposition: A | Discharge status: Home / Self Care | Payer: 59 | Source: Ambulatory Visit | Attending: Urology | Admitting: Urology

## 2017-09-21 DIAGNOSIS — R3121 Asymptomatic microscopic hematuria: Secondary | ICD-10-CM

## 2017-09-21 DIAGNOSIS — R3129 Other microscopic hematuria: Secondary | ICD-10-CM | POA: Diagnosis not present

## 2017-09-21 MED ORDER — IOPAMIDOL (ISOVUE-300) INJECTION 61%
150.0000 mL | Freq: Once | INTRAVENOUS | Status: AC | PRN
  Administered 2017-09-21: 150 mL via INTRAVENOUS

## 2017-09-22 ENCOUNTER — Telehealth: Payer: Self-pay

## 2017-09-22 NOTE — Telephone Encounter (Signed)
-----   Message from Billey Co, MD sent at 09/22/2017  1:25 PM EDT ----- Regarding: normal ct Please let him know his CT was completely normal. No abnormalities at all. Keep scheduled cystoscopy 9/3.  Thanks Billey Co, MD 09/22/2017

## 2017-09-22 NOTE — Telephone Encounter (Signed)
Pt called office asking about CT results and I read Sninsky's note to him.  He will be here on Tuesday for cysto.

## 2017-09-26 ENCOUNTER — Encounter: Payer: Self-pay | Admitting: Urology

## 2017-09-26 ENCOUNTER — Other Ambulatory Visit: Payer: 59 | Admitting: Urology

## 2017-09-26 ENCOUNTER — Telehealth: Payer: Self-pay | Admitting: Urology

## 2017-09-26 NOTE — Telephone Encounter (Signed)
Pt was a no show for CT results and cysto.  Just F.Y.I.

## 2017-09-26 NOTE — Telephone Encounter (Signed)
I called and spoke with pt, he states that he forgot about the cysto, but he is wondering since his last urine looked fine, his ct came back fine, is it necessary to do this? Or is it possible to have another urine sample performed and if it shows any suspicion he would resume with cysto. I explained to pt why we would do the cysto even with a negative ct scan, he states his understanding. Please advise.

## 2017-09-26 NOTE — Telephone Encounter (Signed)
Pt called office stating that he has questions about his CT results and if the Cysto (that was scheduled today)  is really needed vs blood work instead.  Please advise pt @ [phone].

## 2017-09-26 NOTE — Telephone Encounter (Signed)
Would recommend cysto to rule out urethral stricture

## 2017-09-28 NOTE — Telephone Encounter (Signed)
Pt informed, will contact his insurance to find out about the cost, as this was a concern for him. I have scheduled him, he will call our office to confirm appointment scheduled.

## 2017-10-12 ENCOUNTER — Other Ambulatory Visit: Payer: Self-pay | Admitting: Urology

## 2017-11-20 DIAGNOSIS — D485 Neoplasm of uncertain behavior of skin: Secondary | ICD-10-CM | POA: Diagnosis not present

## 2017-11-20 DIAGNOSIS — Z1283 Encounter for screening for malignant neoplasm of skin: Secondary | ICD-10-CM | POA: Diagnosis not present

## 2017-11-20 DIAGNOSIS — L578 Other skin changes due to chronic exposure to nonionizing radiation: Secondary | ICD-10-CM | POA: Diagnosis not present

## 2017-12-04 ENCOUNTER — Ambulatory Visit: Payer: 59 | Admitting: Gastroenterology

## 2017-12-20 DIAGNOSIS — Z0000 Encounter for general adult medical examination without abnormal findings: Secondary | ICD-10-CM | POA: Diagnosis not present

## 2017-12-20 DIAGNOSIS — E782 Mixed hyperlipidemia: Secondary | ICD-10-CM | POA: Diagnosis not present

## 2017-12-20 DIAGNOSIS — I10 Essential (primary) hypertension: Secondary | ICD-10-CM | POA: Diagnosis not present

## 2017-12-25 DIAGNOSIS — Z125 Encounter for screening for malignant neoplasm of prostate: Secondary | ICD-10-CM | POA: Diagnosis not present

## 2017-12-25 DIAGNOSIS — Z79899 Other long term (current) drug therapy: Secondary | ICD-10-CM | POA: Diagnosis not present

## 2017-12-25 DIAGNOSIS — E782 Mixed hyperlipidemia: Secondary | ICD-10-CM | POA: Diagnosis not present

## 2017-12-25 DIAGNOSIS — I10 Essential (primary) hypertension: Secondary | ICD-10-CM | POA: Diagnosis not present

## 2018-02-21 DIAGNOSIS — D225 Melanocytic nevi of trunk: Secondary | ICD-10-CM | POA: Diagnosis not present

## 2018-02-21 DIAGNOSIS — D485 Neoplasm of uncertain behavior of skin: Secondary | ICD-10-CM | POA: Diagnosis not present

## 2018-03-27 DIAGNOSIS — E782 Mixed hyperlipidemia: Secondary | ICD-10-CM | POA: Diagnosis not present

## 2018-03-27 DIAGNOSIS — R3129 Other microscopic hematuria: Secondary | ICD-10-CM | POA: Diagnosis not present

## 2018-04-04 ENCOUNTER — Ambulatory Visit: Payer: 59 | Admitting: Urology

## 2018-04-10 ENCOUNTER — Other Ambulatory Visit: Payer: Self-pay

## 2018-04-10 ENCOUNTER — Encounter: Payer: Self-pay | Admitting: Urology

## 2018-04-10 ENCOUNTER — Ambulatory Visit: Payer: 59 | Admitting: Urology

## 2018-04-10 VITALS — BP 144/99 | HR 70 | Ht 67.0 in | Wt 197.0 lb

## 2018-04-10 DIAGNOSIS — R3129 Other microscopic hematuria: Secondary | ICD-10-CM | POA: Diagnosis not present

## 2018-04-10 NOTE — Patient Instructions (Addendum)
Pelvic Floor Dysfunction  Pelvic floor dysfunction (PFD) is a condition that results when the group of muscles and connective tissues that support the organs in the pelvis (pelvic floor muscles) do not work well. These muscles and their connections form a sling that supports the colon and bladder. In men, these muscles also support the prostate gland. In women, they also support the uterus. PFD causes pelvic floor muscles to be too weak, too tight, or a combination of both. In PFD, muscle movements are not coordinated. This condition may cause bowel or bladder problems. It may also cause pain. What are the causes? This condition may be caused by an injury to the pelvic area or by a weakening of pelvic muscles. This often results from pregnancy and childbirth or other types of strain. In many cases, the exact cause is not known. What increases the risk? The following factors may make you more likely to develop this condition:  Having a condition of chronic bladder tissue inflammation (interstitial cystitis).  Being an older person.  Being overweight.  Radiation treatment for cancer in the pelvic region.  Previous pelvic surgery, such as removal of the uterus (hysterectomy) or prostate gland (prostatectomy). What are the signs or symptoms? Symptoms of this condition vary and may include:  Bladder symptoms, such as: ? Trouble starting urination and emptying the bladder. ? Frequent urinary tract infections. ? Leaking urine when coughing, laughing, or exercising (stress incontinence). ? Having to pass urine urgently or frequently. ? Pain when passing urine.  Bowel symptoms, such as: ? Constipation. ? Urgent or frequent bowel movements. ? Incomplete bowel movements. ? Painful bowel movements. ? Leaking stool or gas.  Unexplained genital or rectal pain.  Genital or rectal muscle spasms.  Low back pain. In women, symptoms of PFD may also include:  A heavy, full, or aching feeling in  the vagina.  A bulge that protrudes into the vagina.  Pain during or after sexual intercourse. How is this diagnosed? This condition may be diagnosed based on:  Your symptoms and medical history.  A physical exam. During the exam, your health care provider may check your pelvic muscles for tightness, spasm, pain, or weakness. This may include a rectal exam and a pelvic exam for women. In some cases, you may have diagnostic tests, such as:  Electrical muscle function tests.  Urine flow testing.  X-ray tests of bowel function.  Ultrasound of the pelvic organs. How is this treated? Treatment for this condition depends on your symptoms. Treatment options include:  Physical therapy. This may include Kegel exercises to help relax or strengthen the pelvic floor muscles.  Biofeedback. This type of therapy provides feedback on how tight your pelvic floor muscles are so that you can learn to control them.  Internal or external massage therapy.  A treatment that involves electrical stimulation of the pelvic floor muscles to help control pain (transcutaneous electrical nerve stimulation, or TENS).  Sound wave therapy (ultrasound) to reduce muscle spasms.  Medicines, such as: ? Muscle relaxants. ? Bladder control medicines. Surgery to reconstruct or support pelvic floor muscles may be an option if other treatments do not help. Follow these instructions at home: Activity  Do your usual activities as told by your health care provider. Ask your health care provider if you should modify any activities.  Do pelvic floor strengthening or relaxing exercises at home as told by your physical therapist. Lifestyle  Maintain a healthy weight.  Eat foods that are high in fiber, such as  beans, whole grains, and fresh fruits and vegetables.  Limit foods that are high in fat and processed sugars, such as fried or sweet foods.  Manage stress with relaxation techniques such as yoga or meditation.  General instructions  If you have problems with leakage: ? Use absorbable pads or wear padded underwear. ? Wash frequently with mild soap. ? Keep your genital and anal area as clean and dry as possible. ? Ask your health care provider if you should try a barrier cream to prevent skin irritation.  Take warm baths to relieve pelvic muscle tension or spasms.  Take over-the-counter and prescription medicines only as told by your health care provider.  Keep all follow-up visits as told by your health care provider. This is important. Contact a health care provider if you:  Are not improving with home care.  Have signs or symptoms of PFD that get worse at home.  Develop new signs or symptoms at home.  Have signs of a urinary tract infection, such as: ? Fever. ? Chills. ? Urinary frequency. ? A burning feeling when urinating.  Have not had a bowel movement in 3 days (constipation). Summary  Pelvic floor dysfunction results when the muscles and connective tissues in your pelvic floor do not work well.  These muscles and their connections form a sling that supports your colon and bladder. In men, these muscles also support the prostate gland. In women, they also support the uterus.  PFD may be caused by an injury to the pelvic area or by a weakening of pelvic muscles.  PFD causes pelvic floor muscles to be too weak, too tight, or a combination of both. Symptoms may vary from person to person.  In most cases, PFD can be treated with physical therapies and medicines. Surgery may be an option if other treatments do not help. This information is not intended to replace advice given to you by your health care provider. Make sure you discuss any questions you have with your health care provider. Document Released: 07/31/2017 Document Revised: 07/31/2017 Document Reviewed: 07/31/2017 Elsevier Interactive Patient Education  2019 [employer].    Cystoscopy  Cystoscopy is a procedure  that is used to help diagnose and sometimes treat conditions that affect that lower urinary tract. The lower urinary tract includes the bladder and the tube that drains urine from the bladder out of the body (urethra). Cystoscopy is performed with a thin, tube-shaped instrument with a light and camera at the end (cystoscope). The cystoscope may be hard (rigid) or flexible, depending on the goal of the procedure.The cystoscope is inserted through the urethra, into the bladder. Cystoscopy may be recommended if you have:  Urinary tractinfections that keep coming back (recurring).  Blood in the urine (hematuria).  Loss of bladder control (urinary incontinence) or an overactive bladder.  Unusual cells found in a urine sample.  A blockage in the urethra.  Painful urination.  An abnormality in the bladder found during an intravenous pyelogram (IVP) or CT scan. Cystoscopy may also be done to remove a sample of tissue to be examined under a microscope (biopsy). Tell a health care provider about:  Any allergies you have.  All medicines you are taking, including vitamins, herbs, eye drops, creams, and over-the-counter medicines.  Any problems you or family members have had with anesthetic medicines.  Any blood disorders you have.  Any surgeries you have had.  Any medical conditions you have.  Whether you are pregnant or may be pregnant. What are the  risks? Generally, this is a safe procedure. However, problems may occur, including:  Infection.  Bleeding.  Allergic reactions to medicines.  Damage to other structures or organs. What happens before the procedure?  Ask your health care provider about: ? Changing or stopping your regular medicines. This is especially important if you are taking diabetes medicines or blood thinners. ? Taking medicines such as aspirin and ibuprofen. These medicines can thin your blood. Do not take these medicines before your procedure if your health care  provider instructs you not to.  Follow instructions from your health care provider about eating or drinking restrictions.  You may be given antibiotic medicine to help prevent infection.  You may have an exam or testing, such as X-rays of the bladder, urethra, or kidneys.  You may have urine tests to check for signs of infection.  Plan to have someone take you home after the procedure. What happens during the procedure?  To reduce your risk of infection,your health care team will wash or sanitize their hands.  You will be given one or more of the following: ? A medicine to help you relax (sedative). ? A medicine to numb the area (local anesthetic).  The area around the opening of your urethra will be cleaned.  The cystoscope will be passed through your urethra into your bladder.  Germ-free (sterile)fluid will flow through the cystoscope to fill your bladder. The fluid will stretch your bladder so that your surgeon can clearly examine your bladder walls.  The cystoscope will be removed and your bladder will be emptied. The procedure may vary among health care providers and hospitals. What happens after the procedure?  You may have some soreness or pain in your abdomen and urethra. Medicines will be available to help you.  You may have some blood in your urine.  Do not drive for 24 hours if you received a sedative. This information is not intended to replace advice given to you by your health care provider. Make sure you discuss any questions you have with your health care provider. Document Released: 01/08/2000 Document Revised: 10/21/2016 Document Reviewed: 11/27/2014 Elsevier Interactive Patient Education  2019 [employer].

## 2018-04-10 NOTE — Progress Notes (Signed)
   04/10/2018 12:30 PM   Jacob Miranda 11/09/1980 [id number]  Reason for visit: Follow up microscopic hematuria and pelvic floor dysfunction  HPI: I saw Mr. Biggins back in urology clinic today.  Briefly, he is a very healthy 38 year old male who I initially saw in August 2019 for microscopic hematuria.  He underwent CT urogram which was essentially normal, but did not follow-up for the cystoscopy portion.  He has a history of intermittent urinary symptoms with dysuria, weak stream, pelvic pain, perineal pain, and groin pain.  He is unable to identify aggravating or alleviating factors.  He does ride a motorcycle and drive a sports car.  I recommended completing microscopic hematuria work-up with cystoscopy to rule out urethral stricture or other abnormality.  We discussed that if this is negative, I would recommend referral to pelvic floor physical therapy in the setting of his likely pelvic floor dysfunction.  Follow-up for next cystoscopy If negative, referral to pelvic floor physical therapy  A total of 15 minutes were spent face-to-face with the patient, greater than 50% was spent in patient education, counseling, and coordination of care regarding microscopic hematuria work-up, pelvic floor dysfunction.   Billey Co, [facility] [address], [place], [place] [address] [phone]

## 2018-04-12 ENCOUNTER — Ambulatory Visit: Payer: 59 | Admitting: Urology

## 2018-04-12 ENCOUNTER — Other Ambulatory Visit: Payer: Self-pay

## 2018-04-12 DIAGNOSIS — N343 Urethral syndrome, unspecified: Secondary | ICD-10-CM

## 2018-04-12 DIAGNOSIS — M6289 Other specified disorders of muscle: Secondary | ICD-10-CM

## 2018-04-12 DIAGNOSIS — R3129 Other microscopic hematuria: Secondary | ICD-10-CM

## 2018-04-12 LAB — URINALYSIS, COMPLETE
Bilirubin, UA: NEGATIVE
GLUCOSE, UA: NEGATIVE
Ketones, UA: NEGATIVE
LEUKOCYTES UA: NEGATIVE
NITRITE UA: NEGATIVE
Protein, UA: NEGATIVE
SPEC GRAV UA: 1.015 (ref 1.005–1.030)
UUROB: 0.2 mg/dL (ref 0.2–1.0)
pH, UA: 5.5 (ref 5.0–7.5)

## 2018-04-12 NOTE — Progress Notes (Signed)
Cystoscopy Procedure Note:  Indication: Microscopic hematuria, pelvic floor dysfunction  After informed consent and discussion of the procedure and its risks, Jacob Miranda was positioned and prepped in the standard fashion. Cystoscopy was performed with a flexible cystoscope. The urethra, bladder neck and entire bladder was visualized in a standard fashion. The urethra was widely patent. The prostate was small in size. The bladder was grossly normal throughout. The ureteral orifices were visualized in their normal location and orientation. No abnormalities on retroflexion.  Findings: Normal cystoscopy  Assessment and Plan: Referral placed to pelvic floor physical therapy RTC 6 months for symptom check  Nickolas Madrid, MD 04/12/2018

## 2018-08-17 IMAGING — US US RENAL
1 series · 14 of 25 positions shown · non-contrast
Comparison: None.

CLINICAL DATA: Microscopic hematuria

EXAM:
RENAL / URINARY TRACT ULTRASOUND COMPLETE

[Series 1: us renal · 0.25mm/px · 14 of 34 slices shown]
[im 1/34]
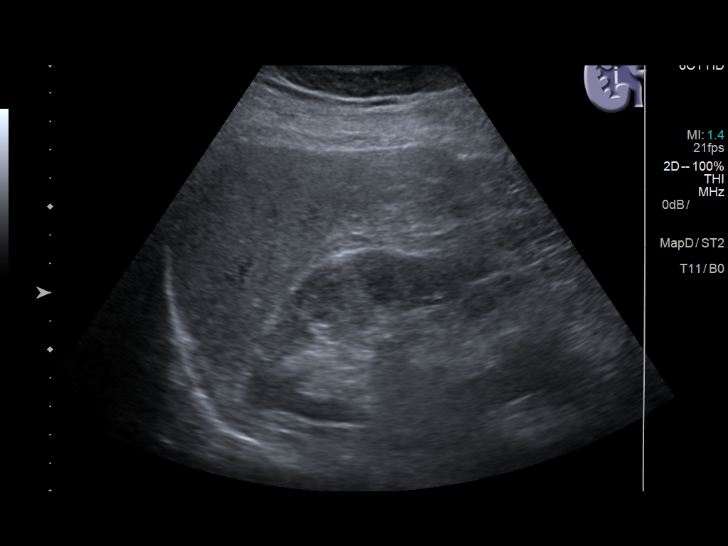
[im 3/34]
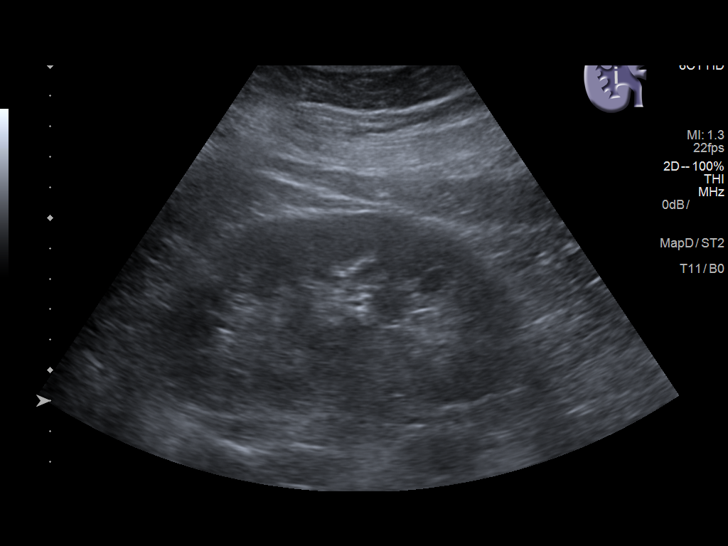
[im 6/34]
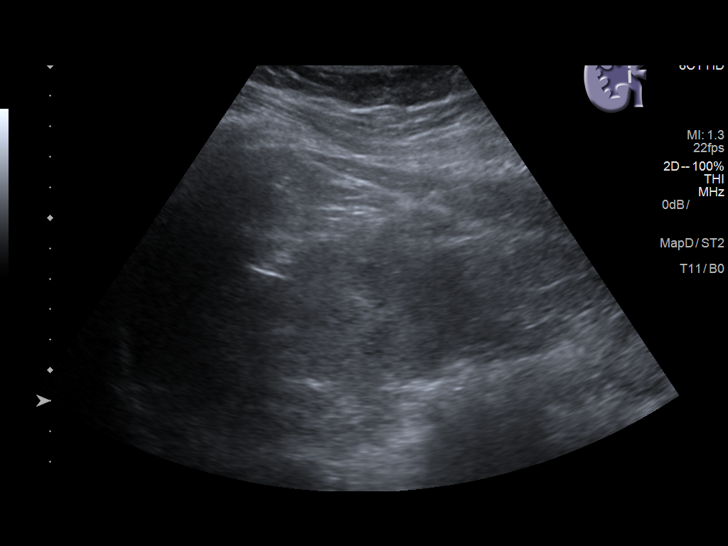
[im 9/34]
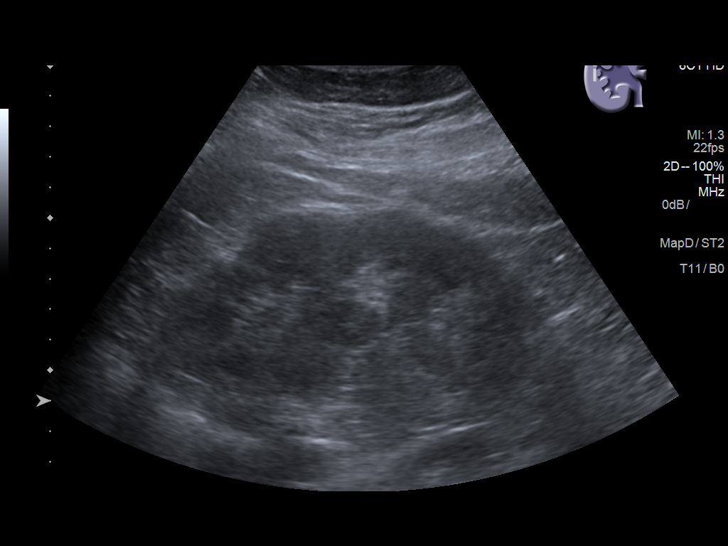
[im 12/34]
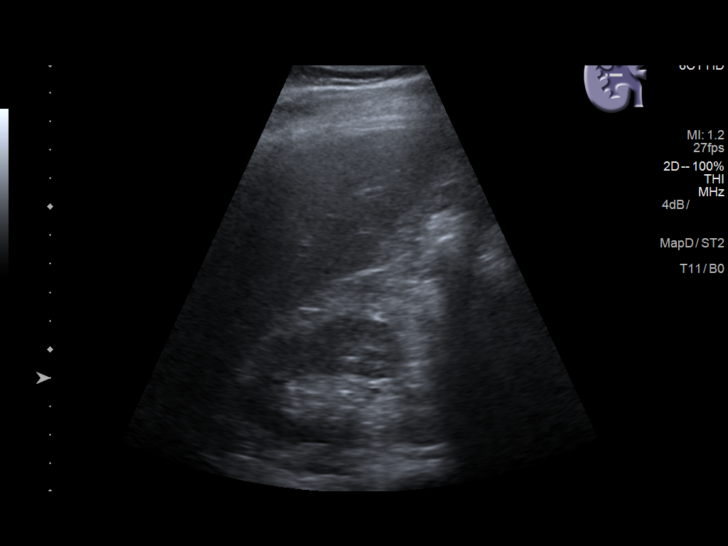
[im 13/34]
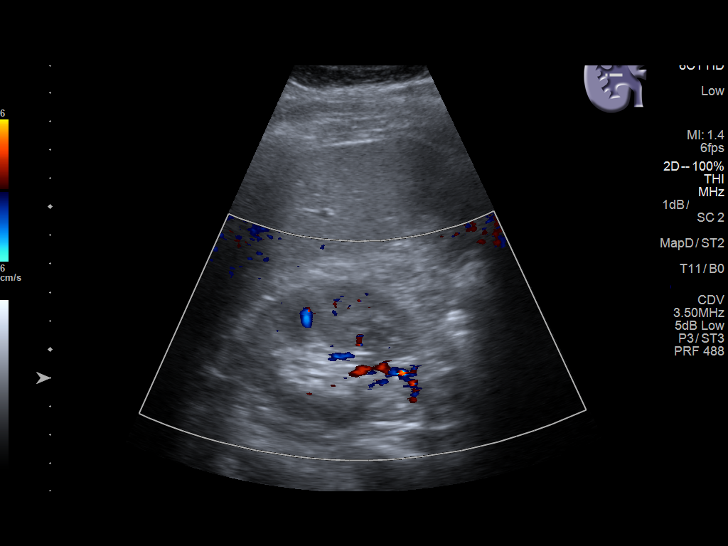
[im 16/34]
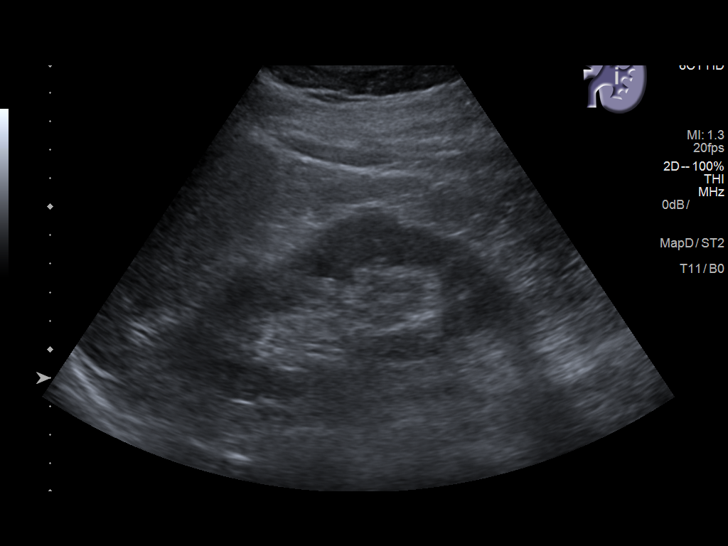
[im 18/34]
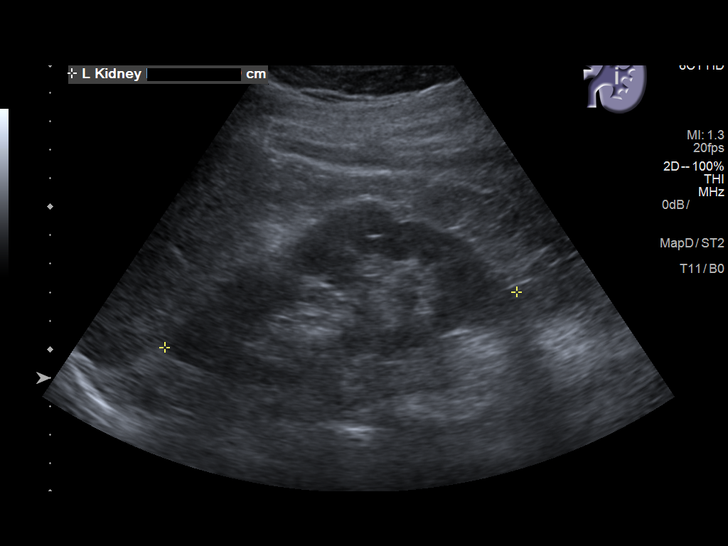
[im 21/34]
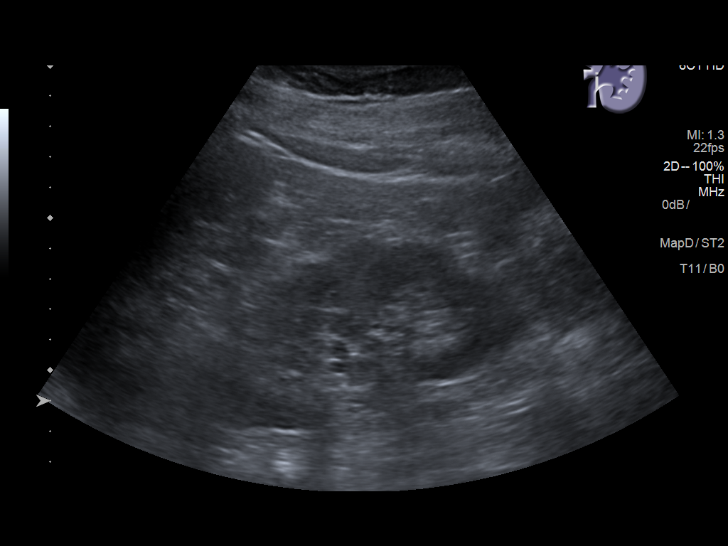
[im 23/34]
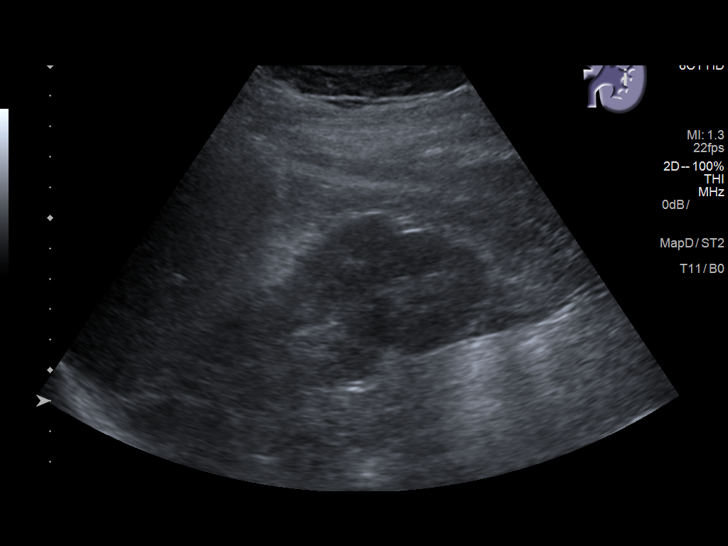
[im 25/34]
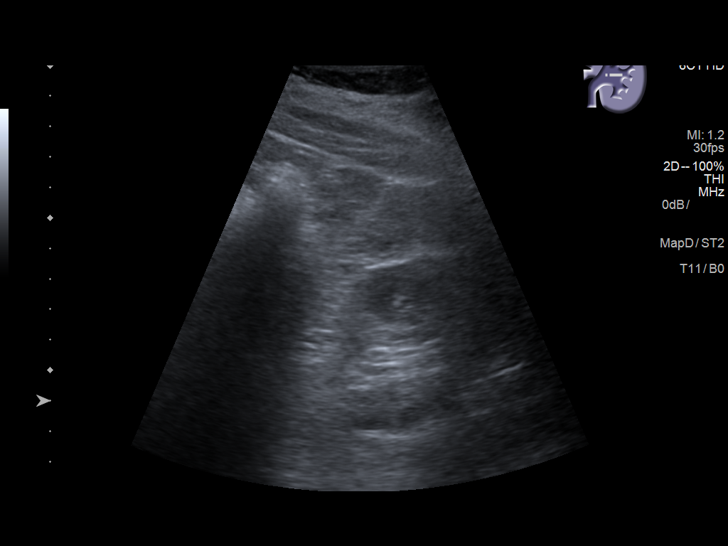
[im 28/34]
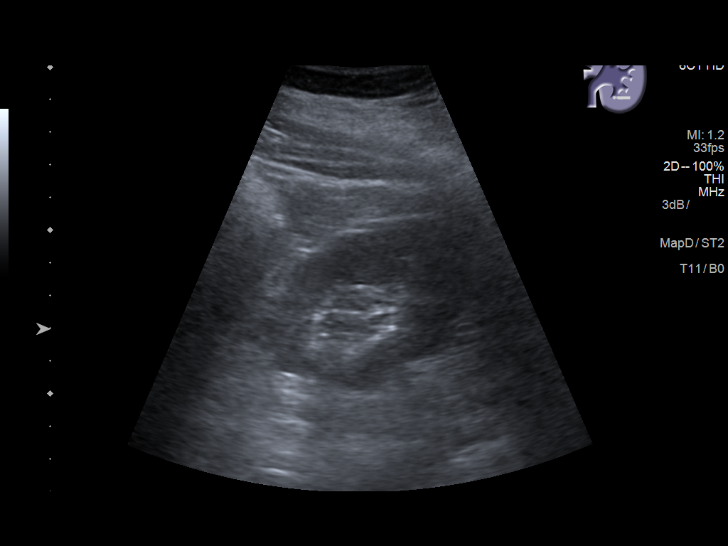
[im 31/34]
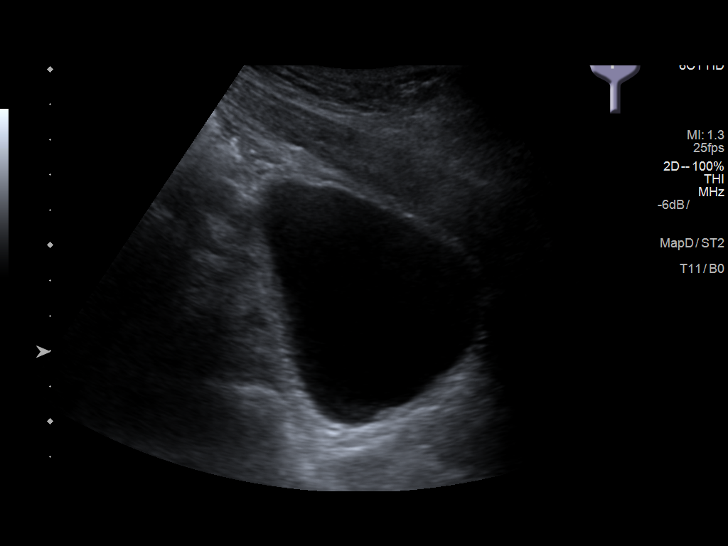
[im 34/34]
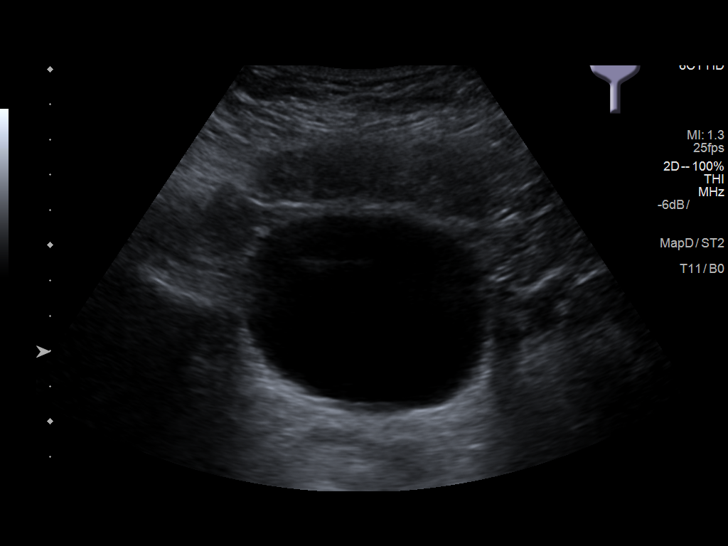

[14 of 25 positions shown; findings below may reference images not displayed]

FINDINGS: Right Kidney:

Length: 11.9 cm. Echogenicity and renal cortical thickness are
within normal limits. No mass, perinephric fluid, or hydronephrosis
visualized. No sonographically demonstrable calculus or
ureterectasis.

Left Kidney:

Length: 12.5 cm. Echogenicity and renal cortical thickness are
within normal limits. No mass, perinephric fluid, or hydronephrosis
visualized. No sonographically demonstrable calculus or
ureterectasis.

Bladder:

Appears normal for degree of bladder distention.
IMPRESSION: Study within normal limits.

## 2018-08-30 IMAGING — US US ABDOMEN LIMITED
1 series · 14 of 25 positions shown · non-contrast
Comparison: CT earlier today

CLINICAL DATA: Right upper quadrant pain

EXAM:
ULTRASOUND ABDOMEN LIMITED RIGHT UPPER QUADRANT

[Series 1: us abdomen limited · 0.19mm/px · 14 of 49 slices shown]
[im 1/49]
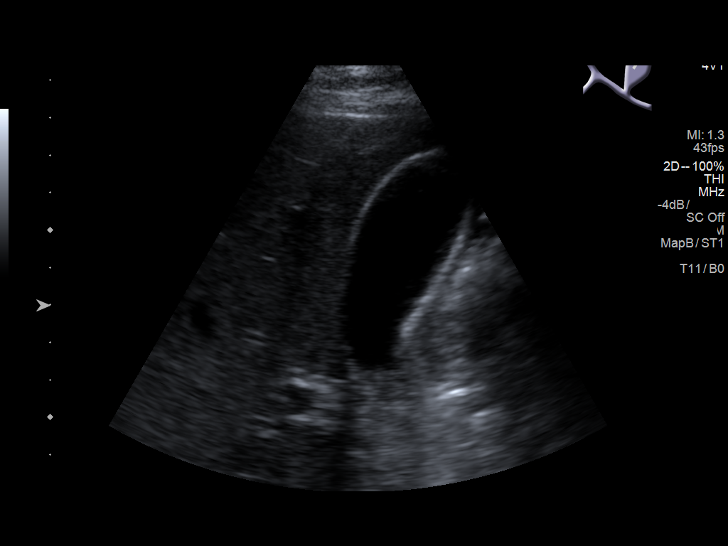
[im 5/49]
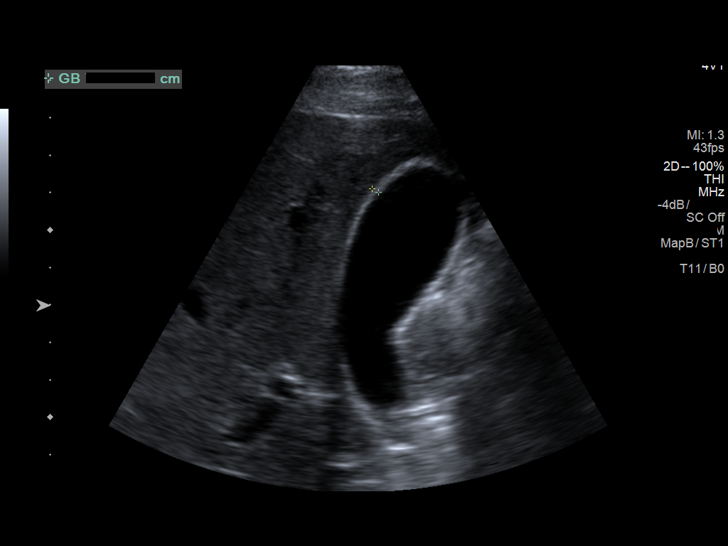
[im 9/49]
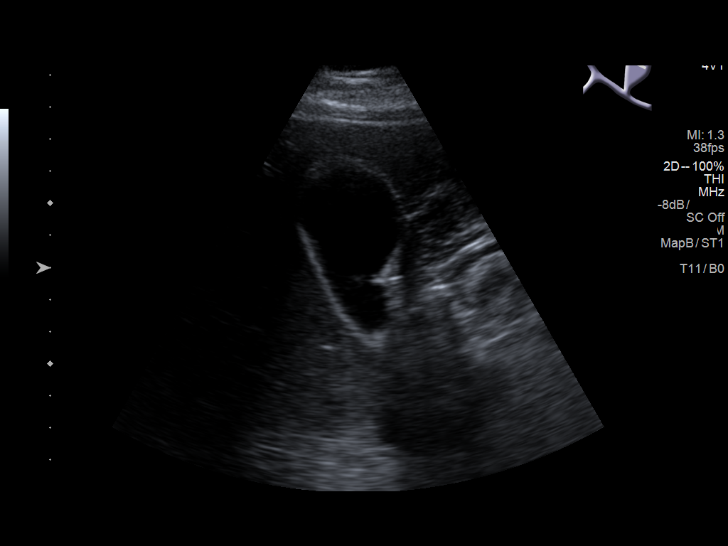
[im 13/49]
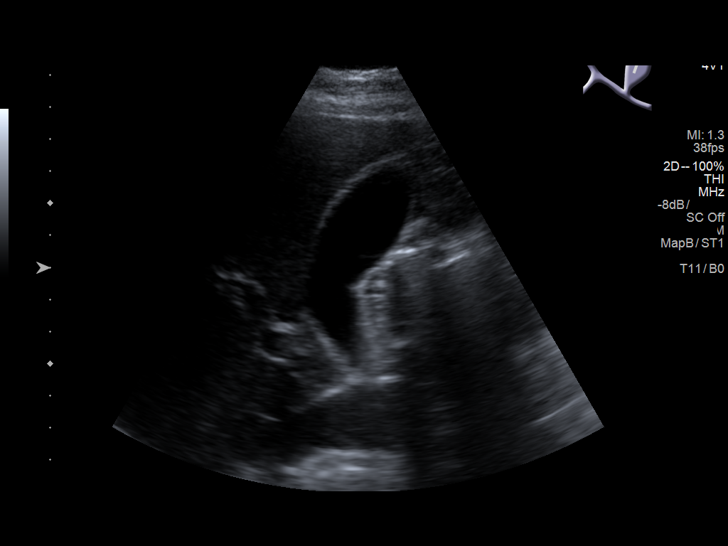
[im 17/49]
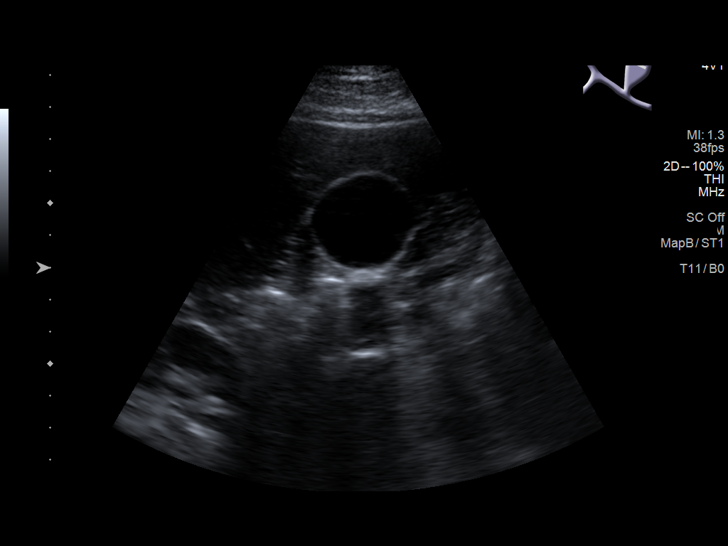
[im 19/49]
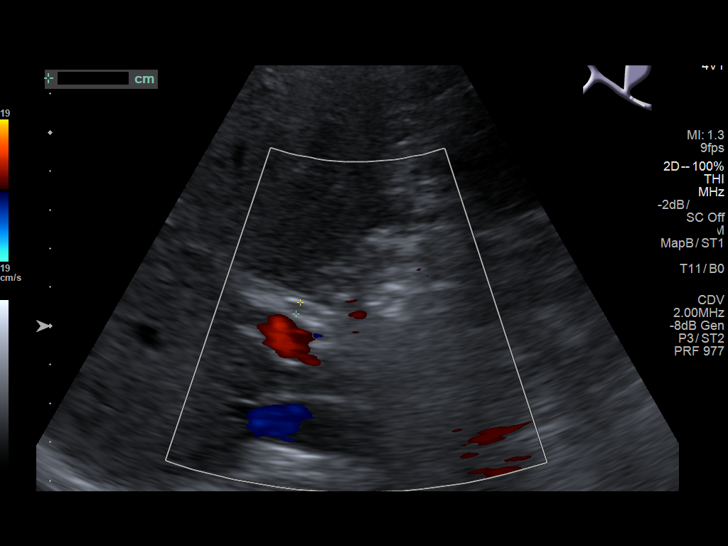
[im 23/49]
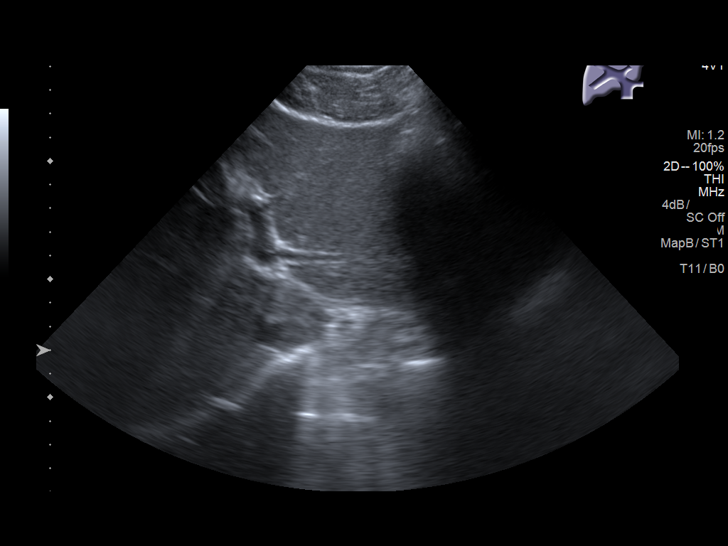
[im 27/49]
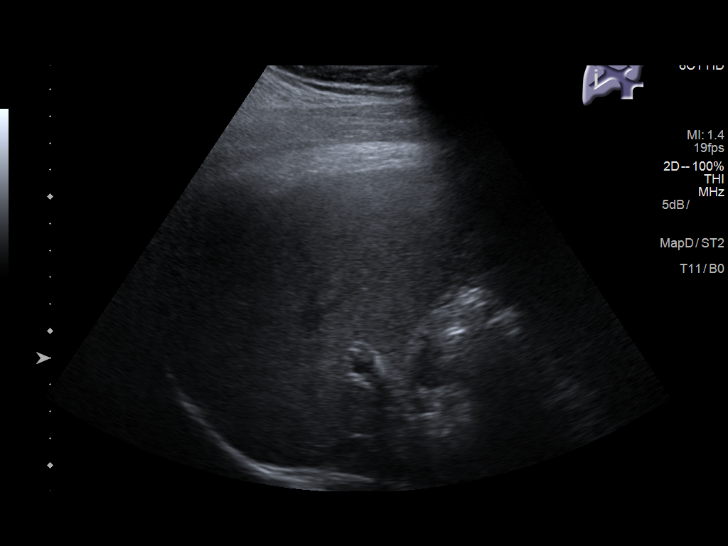
[im 31/49]
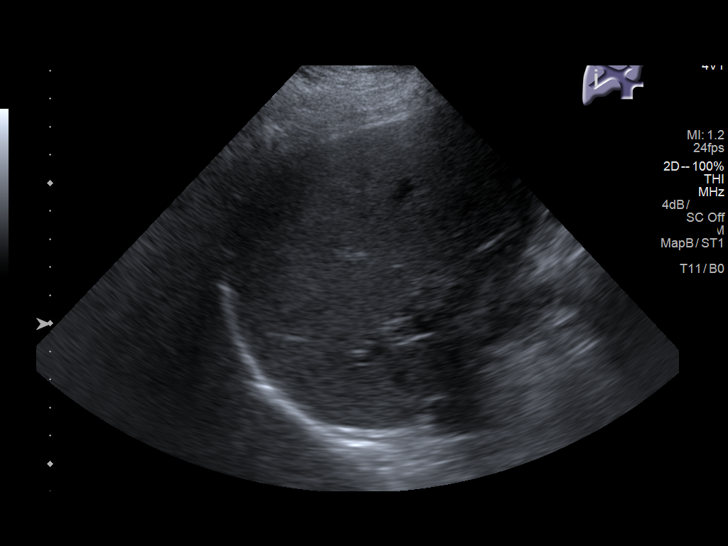
[im 33/49]
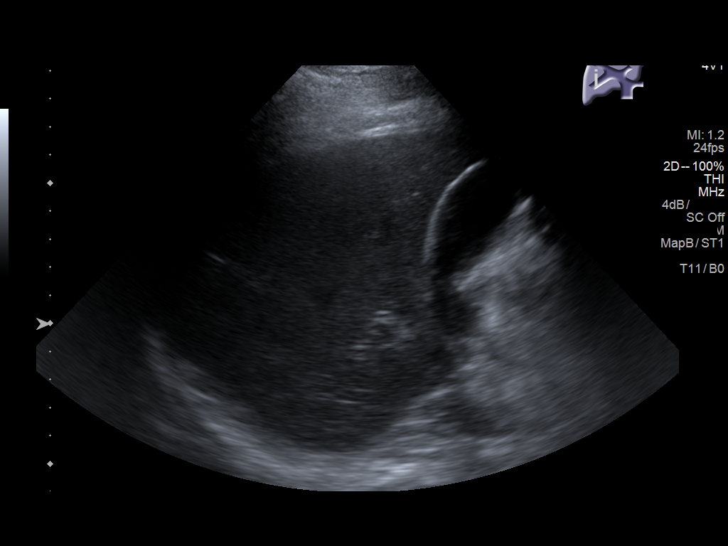
[im 37/49]
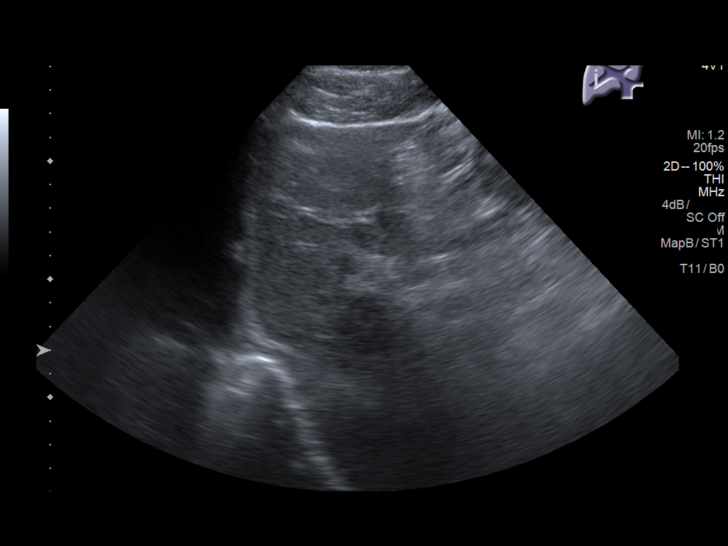
[im 41/49]
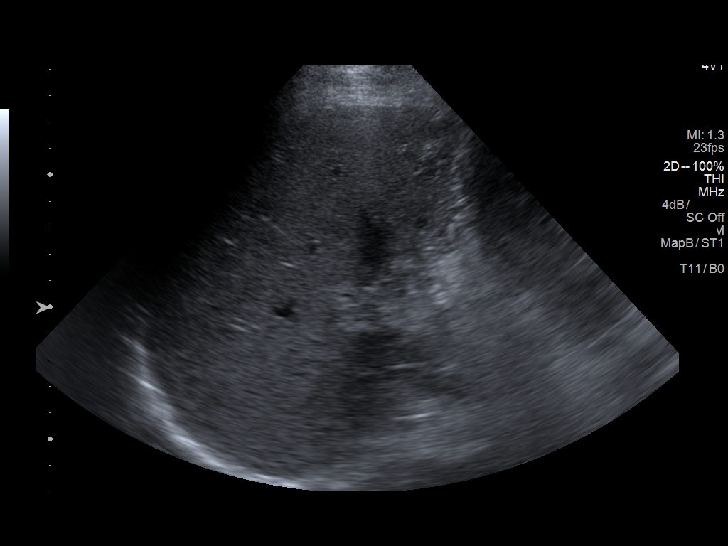
[im 45/49]
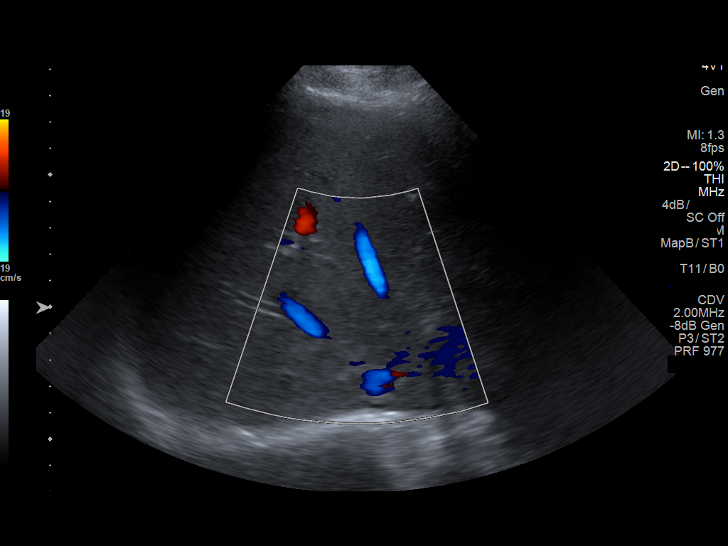
[im 49/49]
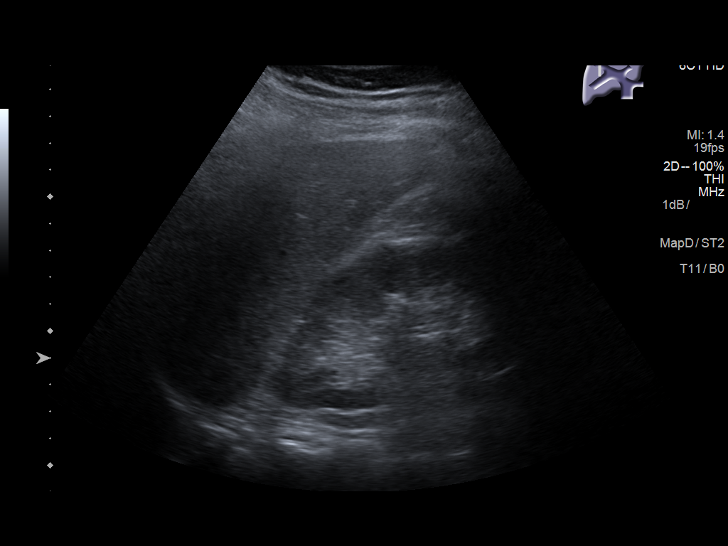

[14 of 25 positions shown; findings below may reference images not displayed]

FINDINGS: Gallbladder:

No gallstones or wall thickening visualized. No sonographic Murphy
sign noted by sonographer.

Common bile duct:

Diameter: Normal caliber, 3 mm

Liver:

No focal lesion identified. Within normal limits in parenchymal
echogenicity. Portal vein is patent on color Doppler imaging with
normal direction of blood flow towards the liver.
IMPRESSION: Negative right upper quadrant ultrasound.

## 2018-09-27 ENCOUNTER — Encounter: Payer: Self-pay | Admitting: Urology

## 2018-09-27 ENCOUNTER — Ambulatory Visit: Payer: 59 | Admitting: Urology

## 2019-01-10 ENCOUNTER — Ambulatory Visit: Payer: 59 | Admitting: Gastroenterology

## 2019-01-16 ENCOUNTER — Encounter: Payer: Self-pay | Admitting: Gastroenterology

## 2019-01-16 ENCOUNTER — Ambulatory Visit (INDEPENDENT_AMBULATORY_CARE_PROVIDER_SITE_OTHER): Payer: 59 | Admitting: Gastroenterology

## 2019-01-16 ENCOUNTER — Other Ambulatory Visit: Payer: Self-pay

## 2019-01-16 ENCOUNTER — Ambulatory Visit: Payer: 59 | Admitting: Gastroenterology

## 2019-01-16 VITALS — BP 142/87 | HR 92 | Temp 98.2°F | Wt 193.0 lb

## 2019-01-16 DIAGNOSIS — K625 Hemorrhage of anus and rectum: Secondary | ICD-10-CM | POA: Diagnosis not present

## 2019-01-16 MED ORDER — HYDROCORTISONE (PERIANAL) 2.5 % EX CREA: 1.0000 "application " | TOPICAL_CREAM | Freq: Two times a day (BID) | CUTANEOUS | 0 refills | Status: AC

## 2019-01-16 NOTE — Progress Notes (Signed)
Jacob Antigua, MD [address]  [place]  [place], [place] [address]  Main: [phone]  Fax: [phone]   Primary Care Physician: Idelle Crouch, MD   Chief Complaint  Patient presents with  . Abdominal Pain    Patient is having bloating and lower abdominal pain     HPI: Jacob Miranda is a 38 y.o. male here to discuss abdominal pain.  Most of his pain is in bilateral hip, to lower abdominal area.  Patient is otherwise healthy male who exercises.  He is not lost any weight.  Good appetite.  No nausea or vomiting.  Reports 1 formed bowel movement a day, without straining.  Has noticed minute amount of blood in the tissue paper only, about 3-4 times over the last year.  May have felt a skin tag at one point as well but has not felt one recently.  Was previously evaluated for this in August 2019 and symptoms were thought to be musculoskeletal.  Has had work-up with PCP for this as well with CT scans last year being non-contributory.  H. pylori stool antigen was also negative in the past  Current Outpatient Medications  Medication Sig Dispense Refill  . ALPRAZolam (XANAX) 1 MG tablet Take 1 mg by mouth 3 (three) times daily as needed.     . bisoprolol-hydrochlorothiazide (ZIAC) 5-6.25 MG tablet Take 1 tablet by mouth daily.  11  . rosuvastatin (CRESTOR) 10 MG tablet Take by mouth.     No current facility-administered medications for this visit.    Allergies as of 01/16/2019 - Review Complete 01/16/2019  Allergen Reaction Noted  . Penicillin g Other (See Comments) 08/29/2016    ROS:  General: Negative for anorexia, weight loss, fever, chills, fatigue, weakness. ENT: Negative for hoarseness, difficulty swallowing , nasal congestion. CV: Negative for chest pain, angina, palpitations, dyspnea on exertion, peripheral edema.  Respiratory: Negative for dyspnea at rest, dyspnea on exertion, cough, sputum, wheezing.  GI: See history of present illness. GU:   Negative for dysuria, hematuria, urinary incontinence, urinary frequency, nocturnal urination.  Endo: Negative for unusual weight change.    Physical Examination:   BP (!) 142/87 (BP Location: Left Arm, Patient Position: Sitting, Cuff Size: Normal)   Pulse 92   Temp 98.2 F (36.8 C) (Oral)   Wt 193 lb (87.5 kg)   BMI 30.23 kg/m   General: Well-nourished, well-developed in no acute distress.  Eyes: No icterus. Conjunctivae pink. Mouth: Oropharyngeal mucosa moist and pink , no lesions erythema or exudate. Neck: Supple, Trachea midline Abdomen: Bowel sounds are normal, nontender, nondistended, no hepatosplenomegaly or masses, no abdominal bruits or hernia , no rebound or guarding.   Rectal exam: No external lesions present, digital rectal exam with no stool in the rectal vault or masses in the rectal vault.  No blood on DRE Extremities: No lower extremity edema. No clubbing or deformities. Neuro: Alert and oriented x 3.  Grossly intact. Skin: Warm and dry, no jaundice.   Psych: Alert and cooperative, normal mood and affect.   Labs: CMP     Component Value Date/Time   NA 138 07/27/2017 0838   K 4.0 07/27/2017 0838   CL 102 07/27/2017 0838   CO2 28 07/27/2017 0838   GLUCOSE 102 (H) 07/27/2017 0838   BUN 20 07/27/2017 0838   CREATININE 0.92 07/27/2017 0838   CALCIUM 9.7 07/27/2017 0838   PROT 8.8 (H) 07/27/2017 0838   ALBUMIN 5.0 07/27/2017 0838   AST 26 07/27/2017  0838   ALT 28 07/27/2017 0838   ALKPHOS 49 07/27/2017 0838   BILITOT 1.1 07/27/2017 0838   GFRNONAA >60 07/27/2017 0838   GFRAA >60 07/27/2017 0838   Lab Results  Component Value Date   WBC 8.6 07/27/2017   HGB 16.8 07/27/2017   HCT 47.6 07/27/2017   MCV 87.3 07/27/2017   PLT 253 07/27/2017    Imaging Studies: No results found.  Assessment and Plan:   Jacob Miranda is a 38 y.o. y/o male with bilateral hip to lower abdominal pain with no alarm symptoms present  We extensively discussed various  etiologies that his pain could be from He understands that this is again most likely musculoskeletal or functional abdominal pain given his negative work-up including imaging and labs so far  However, due to ongoing symptoms we did discuss a colonoscopy, especially with intermittent blood on the toilet paper.  However, we discussed that this is likely due to underlying internal hemorrhoids.  However, he is having formed to soft stool.  He is willing to try steroid topically and if this completely takes away the blood in his stool, he will be reassured.  However, if the blood per rectum does not resolve completely, he is willing to consider colonoscopy at that time.  We discussed the increasing incidences of colon cancer and that the blood in the stool could represent colon cancer which cannot be ruled out until visualization is done, although it is less likely given his age, no family history and very occasional symptoms with blood only on the toilet paper.  With the above discussion, he chooses to try the steroid topical treatment and let us know if symptoms recur or if he would like a colonoscopy done after thinking about it.  If he calls otherwise see if we can schedule him for colonoscopy at that time.    Dr Jacob Miranda

## 2019-08-10 IMAGING — CT CT RENAL STONE PROTOCOL
3 of 4 series · 10 of 46 positions shown, 17 images · non-contrast
Comparison: Renal ultrasound from 07/14/2017

CLINICAL DATA: Right-sided abdominal pain for several weeks

EXAM:
CT ABDOMEN AND PELVIS WITHOUT CONTRAST
TECHNIQUE: Multidetector CT imaging of the abdomen and pelvis was performed
following the standard protocol without IV contrast.

[Series 4: lung bases · axial · 0.77mm/px · z∈[-630,-510]mm · 6 of 34 slices shown, 11 images]
[im 5/34  soft-tissue]
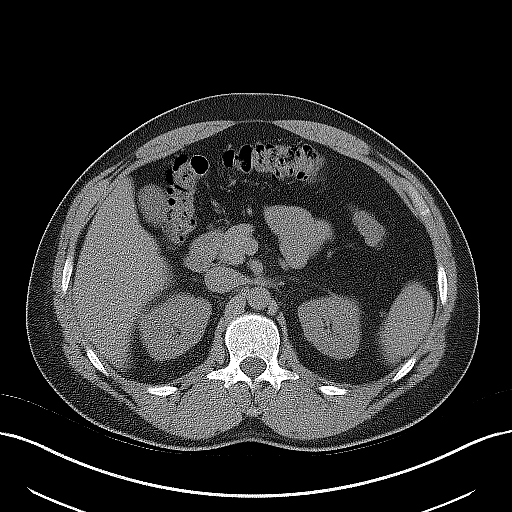
[im 5/34  bone]
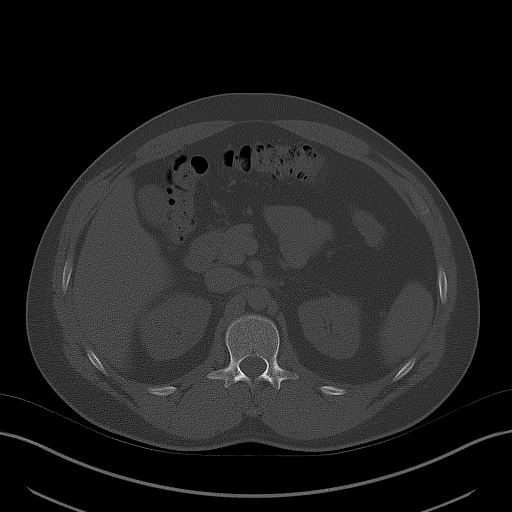
[im 10/34  soft-tissue]
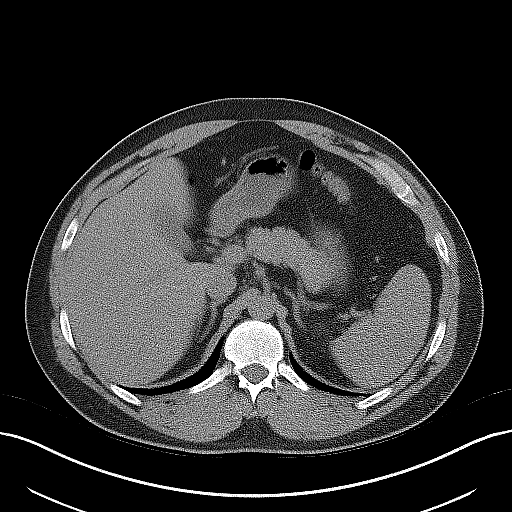
[im 15/34  soft-tissue]
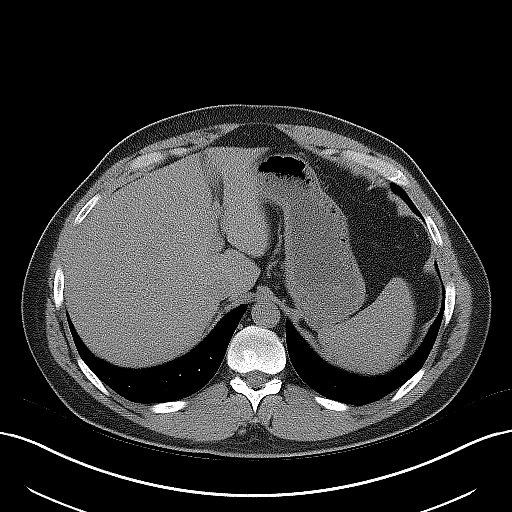
[im 15/34  lung]
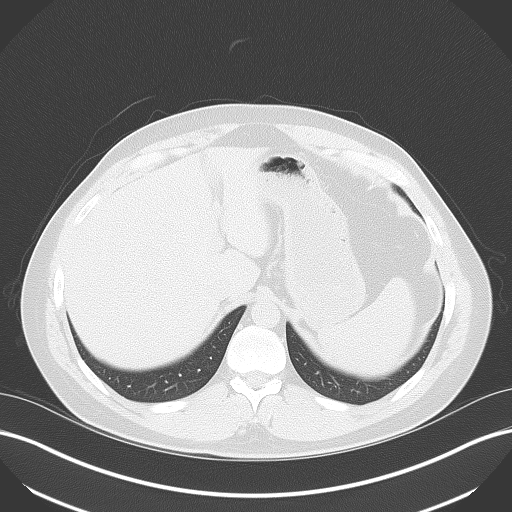
[im 19/34  soft-tissue]
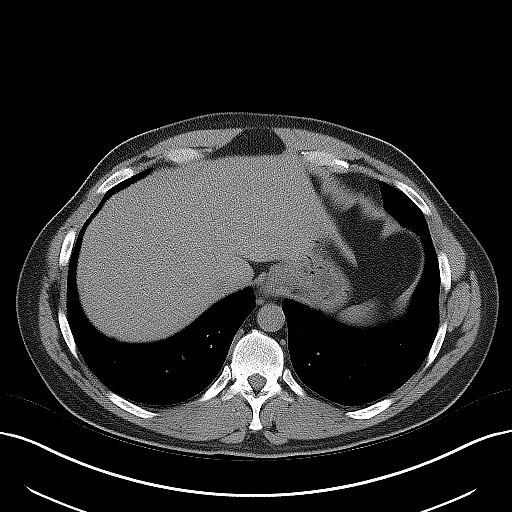
[im 19/34  lung]
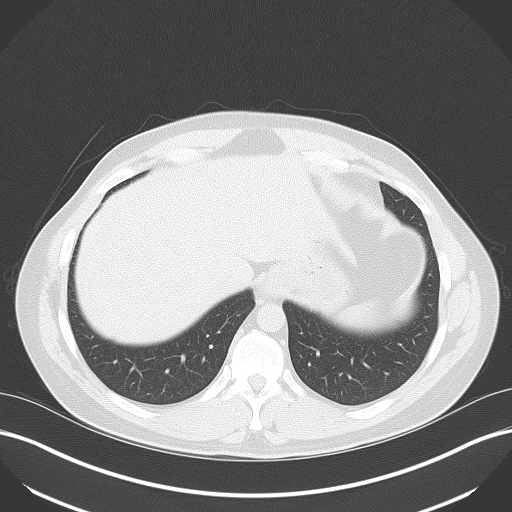
[im 24/34  soft-tissue]
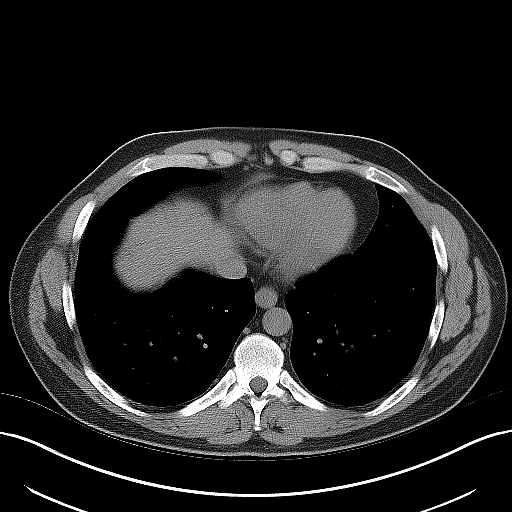
[im 24/34  lung]
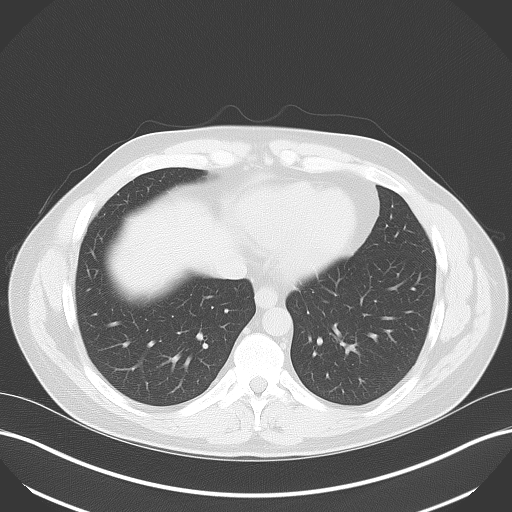
[im 29/34  soft-tissue]
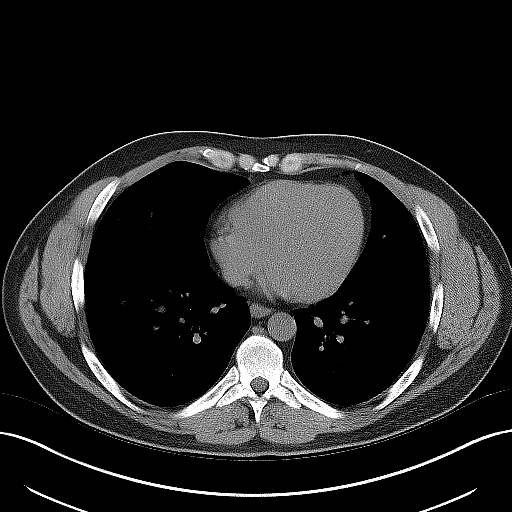
[im 29/34  lung]
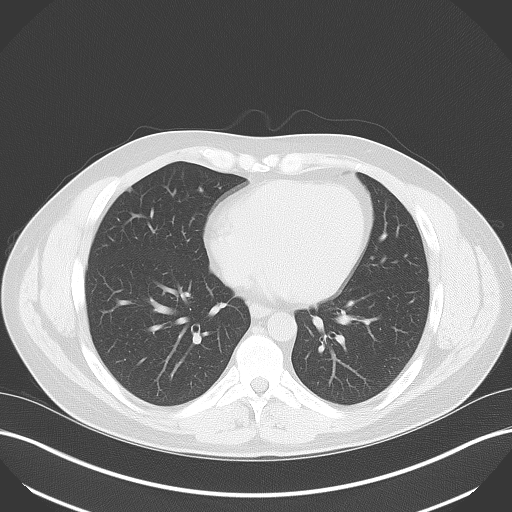

[Series 5: coronal · coronal · 0.85mm/px · 3 of 145 slices shown, 4 images]
[im 49/145  soft-tissue]
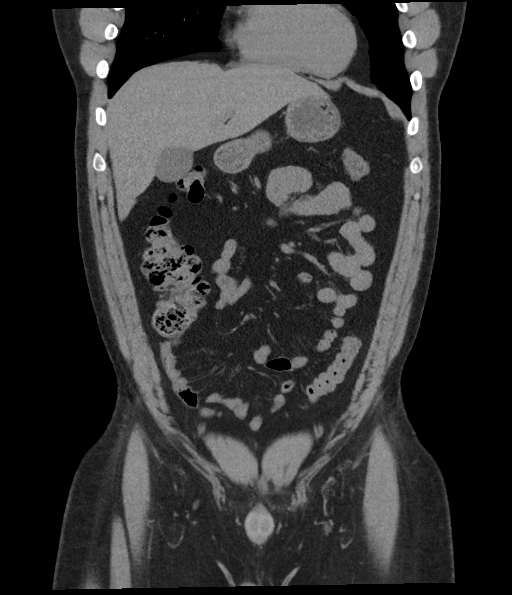
[im 65/145  soft-tissue]
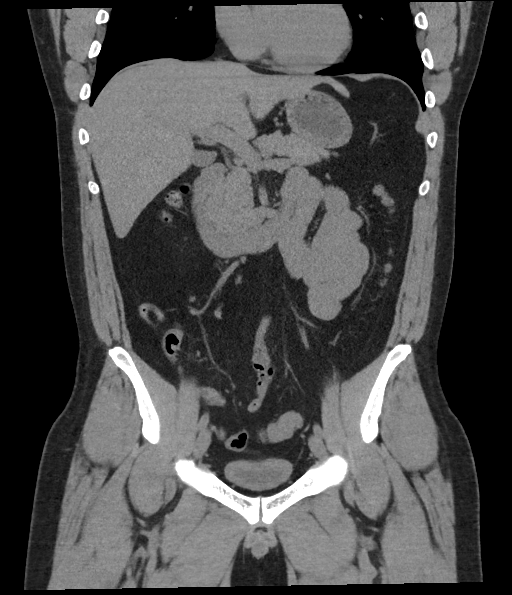
[im 65/145  bone]
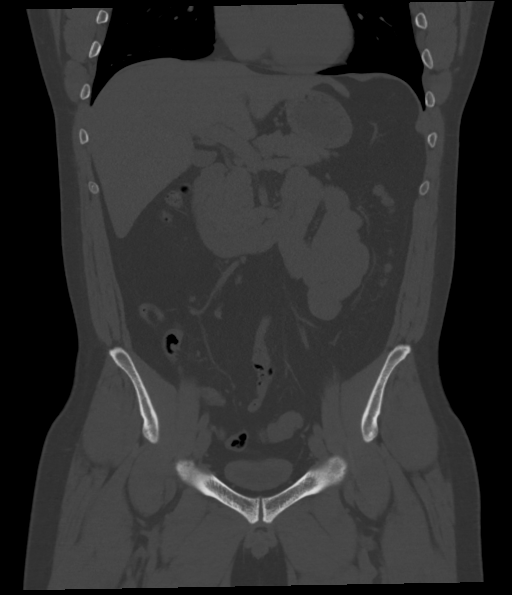
[im 81/145  soft-tissue]
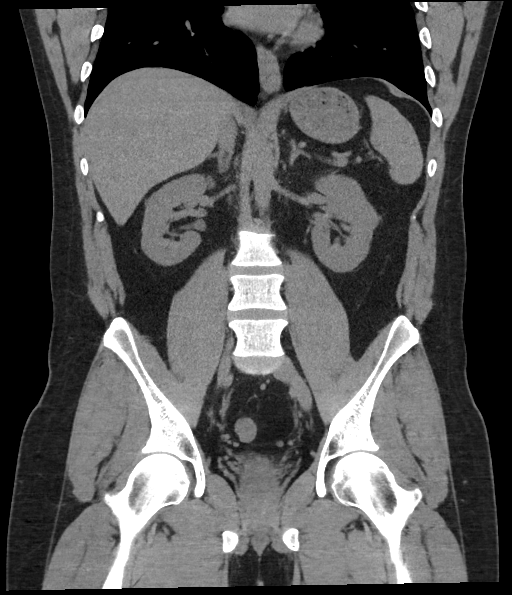

[Series 6: sagittal · sagittal · 0.56mm/px · 1 of 218 slices shown, 2 images]
[im 73/218  soft-tissue]
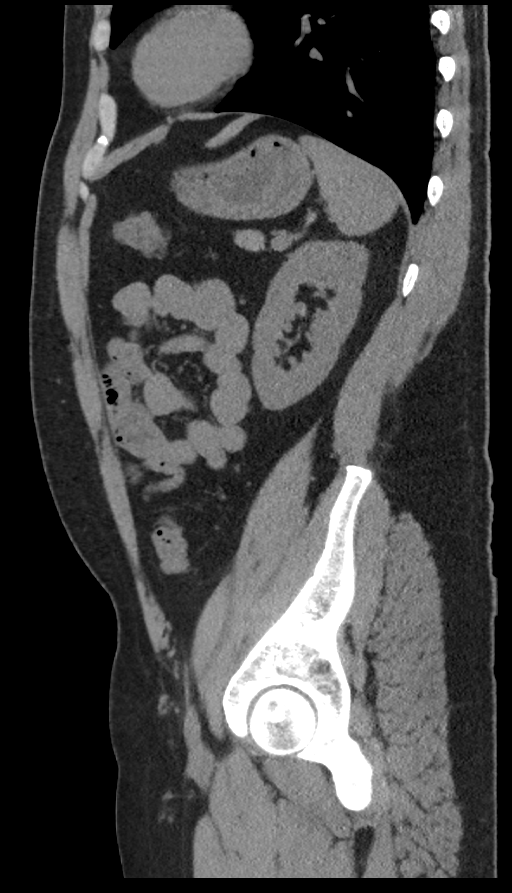
[im 73/218  bone]
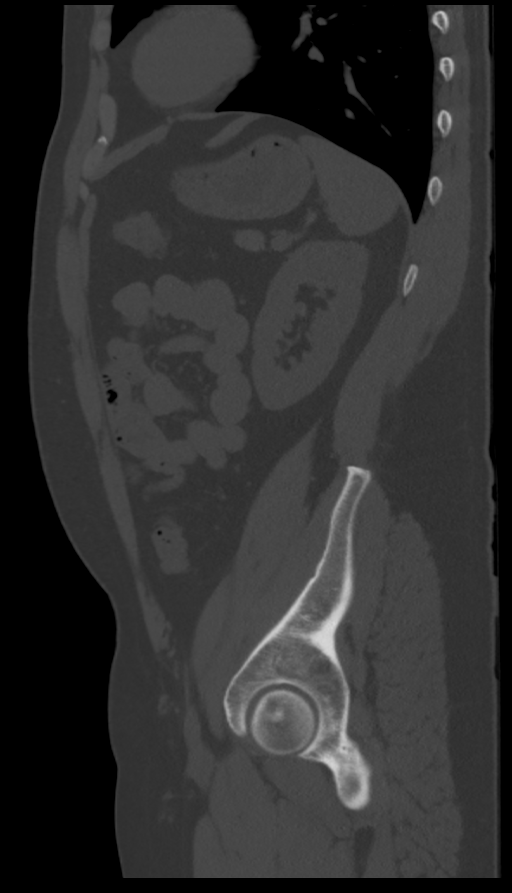

[10 of 46 positions shown; findings below may reference images not displayed]

FINDINGS: Lower chest: No acute abnormality.

Hepatobiliary: No focal liver abnormality is seen. No gallstones,
gallbladder wall thickening, or biliary dilatation.

Pancreas: Unremarkable. No pancreatic ductal dilatation or
surrounding inflammatory changes.

Spleen: Normal in size without focal abnormality.

Adrenals/Urinary Tract: Adrenal glands are unremarkable. Kidneys are
normal, without renal calculi, focal lesion, or hydronephrosis.
Bladder is decompressed.

Stomach/Bowel: Stomach is within normal limits. Appendix appears
normal. No evidence of bowel wall thickening, distention, or
inflammatory changes.

Vascular/Lymphatic: No significant vascular findings are present. No
enlarged abdominal or pelvic lymph nodes.

Reproductive: Prostate is unremarkable.

Other: No abdominal wall hernia or abnormality. No abdominopelvic
ascites.

Musculoskeletal: No acute or significant osseous findings.
IMPRESSION: No acute abnormality noted.

## 2021-11-02 ENCOUNTER — Other Ambulatory Visit: Payer: Self-pay | Admitting: Internal Medicine

## 2021-11-02 DIAGNOSIS — E782 Mixed hyperlipidemia: Secondary | ICD-10-CM

## 2021-11-02 DIAGNOSIS — Z8249 Family history of ischemic heart disease and other diseases of the circulatory system: Secondary | ICD-10-CM

## 2021-11-04 ENCOUNTER — Ambulatory Visit (HOSPITAL_COMMUNITY)
Admission: RE | Admit: 2021-11-04 | Discharge: 2021-11-04 | Disposition: A | Discharge status: Home / Self Care | Payer: Self-pay | Source: Ambulatory Visit | Attending: Internal Medicine | Admitting: Internal Medicine

## 2021-11-04 DIAGNOSIS — E782 Mixed hyperlipidemia: Secondary | ICD-10-CM | POA: Insufficient documentation

## 2021-11-04 DIAGNOSIS — Z8249 Family history of ischemic heart disease and other diseases of the circulatory system: Secondary | ICD-10-CM | POA: Insufficient documentation

## 2021-11-16 ENCOUNTER — Other Ambulatory Visit: Payer: Self-pay | Admitting: Student

## 2021-11-16 DIAGNOSIS — R931 Abnormal findings on diagnostic imaging of heart and coronary circulation: Secondary | ICD-10-CM

## 2021-11-16 DIAGNOSIS — I2510 Atherosclerotic heart disease of native coronary artery without angina pectoris: Secondary | ICD-10-CM

## 2021-11-19 ENCOUNTER — Telehealth (HOSPITAL_COMMUNITY): Payer: Self-pay | Admitting: Emergency Medicine

## 2021-11-19 DIAGNOSIS — R079 Chest pain, unspecified: Secondary | ICD-10-CM

## 2021-11-19 MED ORDER — IVABRADINE HCL 5 MG PO TABS: 10.0000 mg | ORAL_TABLET | Freq: Once | ORAL | 0 refills | Status: DC

## 2021-11-19 MED ORDER — METOPROLOL TARTRATE 100 MG PO TABS: 100.0000 mg | ORAL_TABLET | Freq: Once | ORAL | 0 refills | Status: DC

## 2021-11-19 NOTE — Telephone Encounter (Signed)
Attempted to call patient regarding upcoming cardiac CT appointment. Left message on voicemail with name and callback number Marchia Bond RN Navigator Cardiac Imaging [facility] [phone] Office [phone] Cell  Sent rx to pharm on file Mychart message sent

## 2021-11-19 NOTE — Telephone Encounter (Signed)
Reaching out to patient to offer assistance regarding upcoming cardiac imaging study; pt verbalizes understanding of appt date/time, parking situation and where to check in, pre-test NPO status and medications ordered, and verified current allergies; name and call back number provided for further questions should they arise Lelynd Poer RN Navigator Cardiac Imaging [facility] [phone] office [phone] cell 

## 2021-11-22 ENCOUNTER — Ambulatory Visit
Admission: RE | Admit: 2021-11-22 | Discharge: 2021-11-22 | Disposition: A | Discharge status: Home / Self Care | Payer: 59 | Source: Ambulatory Visit | Attending: Student | Admitting: Student

## 2021-11-22 DIAGNOSIS — R931 Abnormal findings on diagnostic imaging of heart and coronary circulation: Secondary | ICD-10-CM | POA: Diagnosis present

## 2021-11-22 DIAGNOSIS — I2510 Atherosclerotic heart disease of native coronary artery without angina pectoris: Secondary | ICD-10-CM | POA: Diagnosis not present

## 2021-11-22 MED ORDER — METOPROLOL TARTRATE 5 MG/5ML IV SOLN
INTRAVENOUS | Status: AC
  Filled 2021-11-22: qty 10

## 2021-11-22 MED ORDER — NITROGLYCERIN 0.4 MG SL SUBL: 0.8000 mg | SUBLINGUAL_TABLET | Freq: Once | SUBLINGUAL | Status: AC

## 2021-11-22 MED ORDER — NITROGLYCERIN 0.4 MG SL SUBL
SUBLINGUAL_TABLET | SUBLINGUAL | Status: AC
  Filled 2021-11-22: qty 1

## 2021-11-22 MED ORDER — METOPROLOL TARTRATE 5 MG/5ML IV SOLN
10.0000 mg | Freq: Once | INTRAVENOUS | Status: AC
  Administered 2021-11-22: 5 mg via INTRAVENOUS

## 2021-11-22 MED ORDER — NITROGLYCERIN 0.4 MG SL SUBL
SUBLINGUAL_TABLET | SUBLINGUAL | Status: AC
  Administered 2021-11-22: 0.4 mg
  Filled 2021-11-22: qty 2

## 2021-11-22 MED ORDER — IOHEXOL 350 MG/ML SOLN
100.0000 mL | Freq: Once | INTRAVENOUS | Status: AC | PRN
  Administered 2021-11-22: 100 mL via INTRAVENOUS

## 2021-11-22 NOTE — Progress Notes (Signed)
Patient tolerated procedure well. Ambulate w/o difficulty. Denies any lightheadedness or being dizzy. Pt denies any pain at this time. Sitting in chair, drinking water provided. P is encouraged to drink additional water throughout the day and reason explained to patient. Patient verbalized understanding and all questions answered. ABC intact. No further needs at this time. Discharge from procedure area w/o issues.  °

## 2022-11-03 ENCOUNTER — Other Ambulatory Visit: Payer: Self-pay | Admitting: Internal Medicine

## 2022-11-03 DIAGNOSIS — I2510 Atherosclerotic heart disease of native coronary artery without angina pectoris: Secondary | ICD-10-CM

## 2022-11-03 DIAGNOSIS — E7800 Pure hypercholesterolemia, unspecified: Secondary | ICD-10-CM

## 2022-11-03 DIAGNOSIS — I10 Essential (primary) hypertension: Secondary | ICD-10-CM

## 2022-11-11 ENCOUNTER — Ambulatory Visit
Admission: RE | Admit: 2022-11-11 | Discharge: 2022-11-11 | Disposition: A | Discharge status: Home / Self Care | Payer: Self-pay | Source: Ambulatory Visit | Attending: Internal Medicine | Admitting: Internal Medicine

## 2022-11-11 DIAGNOSIS — I10 Essential (primary) hypertension: Secondary | ICD-10-CM | POA: Insufficient documentation

## 2022-11-11 DIAGNOSIS — E7800 Pure hypercholesterolemia, unspecified: Secondary | ICD-10-CM | POA: Insufficient documentation

## 2022-11-11 DIAGNOSIS — I2510 Atherosclerotic heart disease of native coronary artery without angina pectoris: Secondary | ICD-10-CM | POA: Insufficient documentation

## 2022-12-15 ENCOUNTER — Other Ambulatory Visit: Payer: Self-pay

## 2022-12-15 ENCOUNTER — Emergency Department
Admission: EM | Admit: 2022-12-15 | Discharge: 2022-12-15 | Disposition: A | Discharge status: Home / Self Care | Payer: 59 | Attending: Emergency Medicine | Admitting: Emergency Medicine

## 2022-12-15 ENCOUNTER — Emergency Department: Payer: 59

## 2022-12-15 DIAGNOSIS — R42 Dizziness and giddiness: Secondary | ICD-10-CM | POA: Insufficient documentation

## 2022-12-15 DIAGNOSIS — R0789 Other chest pain: Secondary | ICD-10-CM | POA: Diagnosis not present

## 2022-12-15 DIAGNOSIS — R55 Syncope and collapse: Secondary | ICD-10-CM | POA: Diagnosis present

## 2022-12-15 DIAGNOSIS — R110 Nausea: Secondary | ICD-10-CM | POA: Diagnosis not present

## 2022-12-15 DIAGNOSIS — R61 Generalized hyperhidrosis: Secondary | ICD-10-CM | POA: Insufficient documentation

## 2022-12-15 LAB — BASIC METABOLIC PANEL
Anion gap: 12 (ref 5–15)
BUN: 18 mg/dL (ref 6–20)
CO2: 28 mmol/L (ref 22–32)
Calcium: 9.4 mg/dL (ref 8.9–10.3)
Chloride: 98 mmol/L (ref 98–111)
Creatinine, Ser: 1.05 mg/dL (ref 0.61–1.24)
GFR, Estimated: >60 mL/min (ref >60)
Glucose, Bld: 129 mg/dL — ABNORMAL HIGH (ref 70–99)
Potassium: 4.1 mmol/L (ref 3.5–5.1)
Sodium: 138 mmol/L (ref 135–145)

## 2022-12-15 LAB — CBC WITH DIFFERENTIAL/PLATELET
Abs Immature Granulocytes: 0.06 10*3/uL (ref 0.00–0.07)
Basophils Absolute: 0.1 10*3/uL (ref 0.0–0.1)
Basophils Relative: 1 %
Eosinophils Absolute: 0.2 10*3/uL (ref 0.0–0.5)
Eosinophils Relative: 2 %
HCT: 43.7 % (ref 39.0–52.0)
Hemoglobin: 15.1 g/dL (ref 13.0–17.0)
Immature Granulocytes: 1 %
Lymphocytes Relative: 32 %
Lymphs Abs: 3.1 10*3/uL (ref 0.7–4.0)
MCH: 31 pg (ref 26.0–34.0)
MCHC: 34.6 g/dL (ref 30.0–36.0)
MCV: 89.7 fL (ref 80.0–100.0)
Monocytes Absolute: 0.9 10*3/uL (ref 0.1–1.0)
Monocytes Relative: 10 %
Neutro Abs: 5.3 10*3/uL (ref 1.7–7.7)
Neutrophils Relative %: 54 %
Platelets: 288 10*3/uL (ref 150–400)
RBC: 4.87 MIL/uL (ref 4.22–5.81)
RDW: 11.8 % (ref 11.5–15.5)
WBC: 9.7 10*3/uL (ref 4.0–10.5)
nRBC: 0 % (ref 0.0–0.2)

## 2022-12-15 LAB — TROPONIN I (HIGH SENSITIVITY)
Troponin I (High Sensitivity): 3 ng/L (ref <18)
Troponin I (High Sensitivity): 3 ng/L (ref <18)

## 2022-12-15 LAB — CBG MONITORING, ED: Glucose-Capillary: 94 mg/dL (ref 70–99)

## 2022-12-15 NOTE — Discharge Instructions (Signed)
Please reach out to your cardiologist for follow-up outpatient.  Please return for any worsening symptoms.

## 2022-12-15 NOTE — ED Triage Notes (Signed)
Pt reports dizziness, facial numbness and having tremors aprox 1 hour ago. Pt denies weakness or numbness in any extremity. Speech clear. Pt reports intermittent chest discomfort.

## 2022-12-15 NOTE — ED Notes (Signed)
Per MD Braddler no CT head at this time

## 2022-12-15 NOTE — ED Provider Notes (Signed)
[facility] Provider Note    Event Date/Time   First MD Initiated Contact with Patient 12/15/22 2040     (approximate)   History   Dizziness   HPI Jacob Miranda is a 42 y.o. male with history of HLD presenting today for lightheadedness.  Patient states he had just gotten home from his work when he was walking up the stairs and also and felt lightheaded, sweaty, and nauseous.  States he did not have chest pain or palpitations or shortness of breath with this episode.  Afterwards when sitting down he felt some chest tightness but was unsure if this was just anxiety related.  States symptoms quickly resolved and has not had any recurrence of symptoms.  Currently denies chest pain, shortness of breath, nausea, vomiting.  Does admit to some dehydration today as well as stressful week from his job.  Otherwise denies smoking, alcohol use.  Reviewed prior notes as patient does have cardiologist with overall reassuring outpatient workups in the past.     Physical Exam   Triage Vital Signs: ED Triage Vitals  Encounter Vitals Group     BP 12/15/22 1943 (!) 114/95     Systolic BP Percentile --      Diastolic BP Percentile --      Pulse Rate 12/15/22 1943 70     Resp 12/15/22 1943 18     Temp 12/15/22 1943 97.7 F (36.5 C)     Temp Source 12/15/22 1943 Oral     SpO2 12/15/22 1943 100 %     Weight 12/15/22 1938 170 lb (77.1 kg)     Height 12/15/22 1938 5\' 7"  (1.702 m)     Head Circumference --      Peak Flow --      Pain Score 12/15/22 1938 0     Pain Loc --      Pain Education --      Exclude from Growth Chart --     Most recent vital signs: Vitals:   12/15/22 1943 12/15/22 2252  BP: (!) 114/95 (!) 154/101  Pulse: 70 68  Resp: 18 18  Temp: 97.7 F (36.5 C)   SpO2: 100% 99%   Physical Exam: I have reviewed the vital signs and nursing notes. General: Awake, alert, no acute distress.  Nontoxic appearing. Head:  Atraumatic, normocephalic.   ENT:   EOM intact, PERRL. Oral mucosa is pink and moist with no lesions. Neck: Neck is supple with full range of motion, No meningeal signs. Cardiovascular:  RRR, No murmurs. Peripheral pulses palpable and equal bilaterally. Respiratory:  Symmetrical chest wall expansion.  No rhonchi, rales, or wheezes.  Good air movement throughout.  No use of accessory muscles.   Musculoskeletal:  No cyanosis or edema. Moving extremities with full ROM Abdomen:  Soft, nontender, nondistended. Neuro:  GCS 15, moving all four extremities, interacting appropriately. Speech clear. Psych:  Calm, appropriate.   Skin:  Warm, dry, no rash.    ED Results / Procedures / Treatments   Labs (all labs ordered are listed, but only abnormal results are displayed) Labs Reviewed  BASIC METABOLIC PANEL - Abnormal; Notable for the following components:      Result Value   Glucose, Bld 129 (*)    All other components within normal limits  CBC WITH DIFFERENTIAL/PLATELET  CBG MONITORING, ED  TROPONIN I (HIGH SENSITIVITY)  TROPONIN I (HIGH SENSITIVITY)     EKG My EKG interpretation: Rate of 68, normal sinus rhythm, normal axis,  normal intervals.  No acute ST elevations or depressions   RADIOLOGY Independently interpreted chest x-ray with no acute pathology   PROCEDURES:  Critical Care performed: No  Procedures   MEDICATIONS ORDERED IN ED: Medications - No data to display   IMPRESSION / MDM / ASSESSMENT AND PLAN / ED COURSE  I reviewed the triage vital signs and the nursing notes.                              Differential diagnosis includes, but is not limited to, near syncope, vasovagal episode, orthostatic hypotension, low suspicion ACS  Patient's presentation is most consistent with acute complicated illness / injury requiring diagnostic workup.  Patient is a 42 year old male presenting today for brief episode of lightheadedness, sweating, and nausea.  Symptoms seem most consistent with near vasovagal  episode.  He had slight chest tightness after the episode but that quickly resolved.  EKG unremarkable and troponins negative x 2.  Asymptomatic the entire time in the emergency department.  CBC and CMP unremarkable.  Most suspicious for vasovagal episode as described earlier.  I did recommend he call his cardiologist for follow-up outpatient.  He was given strict return precautions and agreeable with this plan.     FINAL CLINICAL IMPRESSION(S) / ED DIAGNOSES   Final diagnoses:  Near syncope     Rx / DC Orders   ED Discharge Orders     None        Note:  This document was prepared using Dragon voice recognition software and may include unintentional dictation errors.   Janith Lima, MD 12/15/22 2325

## 2023-08-10 ENCOUNTER — Other Ambulatory Visit: Payer: Self-pay | Admitting: Internal Medicine

## 2023-08-10 DIAGNOSIS — E7841 Elevated Lipoprotein(a): Secondary | ICD-10-CM

## 2023-08-10 DIAGNOSIS — I2510 Atherosclerotic heart disease of native coronary artery without angina pectoris: Secondary | ICD-10-CM

## 2023-08-10 DIAGNOSIS — E7800 Pure hypercholesterolemia, unspecified: Secondary | ICD-10-CM

## 2023-08-10 DIAGNOSIS — Z789 Other specified health status: Secondary | ICD-10-CM
